# Patient Record
Sex: Male | Born: 1980 | Race: Black or African American | Hispanic: No | Marital: Single | Smoking: Former smoker
Health system: Southern US, Community
[De-identification: ages and names within clinical notes are randomized; demographics above are authoritative.]

## PROBLEM LIST (undated history)

## (undated) DIAGNOSIS — G35 Multiple sclerosis: Secondary | ICD-10-CM

## (undated) DIAGNOSIS — M1990 Unspecified osteoarthritis, unspecified site: Secondary | ICD-10-CM

## (undated) DIAGNOSIS — E669 Obesity, unspecified: Secondary | ICD-10-CM

## (undated) DIAGNOSIS — G709 Myoneural disorder, unspecified: Secondary | ICD-10-CM

## (undated) DIAGNOSIS — I10 Essential (primary) hypertension: Secondary | ICD-10-CM

## (undated) HISTORY — DX: Multiple sclerosis: G35

## (undated) HISTORY — DX: Essential (primary) hypertension: I10

## (undated) HISTORY — DX: Myoneural disorder, unspecified: G70.9

## (undated) HISTORY — DX: Unspecified osteoarthritis, unspecified site: M19.90

---
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
## 2001-05-18 ENCOUNTER — Emergency Department (HOSPITAL_COMMUNITY): Admission: EM | Admit: 2001-05-18 | Discharge: 2001-05-18 | Payer: Self-pay

## 2001-06-08 ENCOUNTER — Emergency Department (HOSPITAL_COMMUNITY): Admission: EM | Admit: 2001-06-08 | Discharge: 2001-06-09 | Payer: Self-pay | Admitting: Emergency Medicine

## 2001-06-08 ENCOUNTER — Encounter: Payer: Self-pay | Admitting: Emergency Medicine

## 2001-11-27 ENCOUNTER — Emergency Department (HOSPITAL_COMMUNITY): Admission: EM | Admit: 2001-11-27 | Discharge: 2001-11-28 | Payer: Self-pay | Admitting: Emergency Medicine

## 2002-05-01 ENCOUNTER — Emergency Department (HOSPITAL_COMMUNITY): Admission: EM | Admit: 2002-05-01 | Discharge: 2002-05-01 | Payer: Self-pay | Admitting: Emergency Medicine

## 2004-06-12 ENCOUNTER — Emergency Department (HOSPITAL_COMMUNITY): Admission: EM | Admit: 2004-06-12 | Discharge: 2004-06-12 | Payer: Self-pay | Admitting: Emergency Medicine

## 2005-06-17 ENCOUNTER — Emergency Department (HOSPITAL_COMMUNITY): Admission: EM | Admit: 2005-06-17 | Discharge: 2005-06-17 | Payer: Self-pay | Admitting: Emergency Medicine

## 2005-06-18 ENCOUNTER — Emergency Department (HOSPITAL_COMMUNITY): Admission: EM | Admit: 2005-06-18 | Discharge: 2005-06-18 | Payer: Self-pay | Admitting: Emergency Medicine

## 2006-04-08 ENCOUNTER — Emergency Department (HOSPITAL_COMMUNITY): Admission: EM | Admit: 2006-04-08 | Discharge: 2006-04-08 | Payer: Self-pay | Admitting: Emergency Medicine

## 2007-03-08 ENCOUNTER — Emergency Department (HOSPITAL_COMMUNITY): Admission: EM | Admit: 2007-03-08 | Discharge: 2007-03-08 | Payer: Self-pay | Admitting: Emergency Medicine

## 2008-06-09 ENCOUNTER — Emergency Department (HOSPITAL_COMMUNITY): Admission: EM | Admit: 2008-06-09 | Discharge: 2008-06-09 | Payer: Self-pay | Admitting: Family Medicine

## 2008-06-11 ENCOUNTER — Emergency Department (HOSPITAL_COMMUNITY): Admission: EM | Admit: 2008-06-11 | Discharge: 2008-06-11 | Payer: Self-pay | Admitting: Emergency Medicine

## 2008-06-17 ENCOUNTER — Encounter: Admission: RE | Admit: 2008-06-17 | Discharge: 2008-06-17 | Payer: Self-pay | Admitting: Family Medicine

## 2008-06-26 ENCOUNTER — Emergency Department (HOSPITAL_COMMUNITY): Admission: EM | Admit: 2008-06-26 | Discharge: 2008-06-26 | Payer: Self-pay | Admitting: Emergency Medicine

## 2008-07-27 ENCOUNTER — Ambulatory Visit: Payer: Self-pay | Admitting: Internal Medicine

## 2008-07-27 DIAGNOSIS — G373 Acute transverse myelitis in demyelinating disease of central nervous system: Secondary | ICD-10-CM | POA: Insufficient documentation

## 2008-07-27 DIAGNOSIS — I10 Essential (primary) hypertension: Secondary | ICD-10-CM | POA: Insufficient documentation

## 2008-08-09 ENCOUNTER — Encounter: Payer: Self-pay | Admitting: Internal Medicine

## 2009-02-17 DIAGNOSIS — G35 Multiple sclerosis: Secondary | ICD-10-CM

## 2009-02-17 HISTORY — DX: Multiple sclerosis: G35

## 2009-08-08 ENCOUNTER — Emergency Department (HOSPITAL_COMMUNITY): Admission: EM | Admit: 2009-08-08 | Discharge: 2009-08-08 | Payer: Self-pay | Admitting: Family Medicine

## 2009-08-11 ENCOUNTER — Encounter: Payer: Self-pay | Admitting: Internal Medicine

## 2010-03-21 NOTE — Letter (Signed)
Summary: [facility]   Imported By: Lester Bylas 08/17/2009 09:35:58  _____________________________________________________________________  External Attachment:    Type:   Image     Comment:   External Document

## 2010-03-21 NOTE — Letter (Signed)
Summary: [facility]   Imported By: Sherian Rein 09/10/2009 14:52:16  _____________________________________________________________________  External Attachment:    Type:   Image     Comment:   External Document

## 2010-05-05 LAB — POCT I-STAT, CHEM 8
BUN: 21 mg/dL (ref 6–23)
Calcium, Ion: 1.15 mmol/L (ref 1.12–1.32)
Chloride: 103 mEq/L (ref 96–112)
Creatinine, Ser: 1.2 mg/dL (ref 0.4–1.5)
Glucose, Bld: 90 mg/dL (ref 70–99)
HCT: 47 % (ref 39.0–52.0)
Hemoglobin: 16 g/dL (ref 13.0–17.0)
Potassium: 4 mEq/L (ref 3.5–5.1)
Sodium: 138 mEq/L (ref 135–145)
TCO2: 28 mmol/L (ref 0–100)

## 2010-05-28 LAB — POCT I-STAT, CHEM 8
BUN: 18 mg/dL (ref 6–23)
Calcium, Ion: 1.22 mmol/L (ref 1.12–1.32)
Chloride: 102 mEq/L (ref 96–112)
Creatinine, Ser: 1.1 mg/dL (ref 0.4–1.5)
Glucose, Bld: 100 mg/dL — ABNORMAL HIGH (ref 70–99)
HCT: 46 % (ref 39.0–52.0)
Hemoglobin: 15.6 g/dL (ref 13.0–17.0)
Potassium: 4.5 mEq/L (ref 3.5–5.1)
Sodium: 138 mEq/L (ref 135–145)
TCO2: 27 mmol/L (ref 0–100)

## 2010-05-29 LAB — COMPREHENSIVE METABOLIC PANEL
AST: 18 U/L (ref 0–37)
Albumin: 4.2 g/dL (ref 3.5–5.2)
Calcium: 9.6 mg/dL (ref 8.4–10.5)
Chloride: 104 mEq/L (ref 96–112)
Creatinine, Ser: 0.83 mg/dL (ref 0.4–1.5)
GFR calc Af Amer: >60 mL/min (ref >60)
Total Bilirubin: 1 mg/dL (ref 0.3–1.2)
Total Protein: 6.7 g/dL (ref 6.0–8.3)

## 2010-05-29 LAB — CBC
MCV: 91.6 fL (ref 78.0–100.0)
Platelets: 263 10*3/uL (ref 150–400)
RDW: 13.5 % (ref 11.5–15.5)
WBC: 4.4 10*3/uL (ref 4.0–10.5)

## 2010-05-29 LAB — RAPID URINE DRUG SCREEN, HOSP PERFORMED
Benzodiazepines: NOT DETECTED
Cocaine: NOT DETECTED
Opiates: NOT DETECTED

## 2010-05-29 LAB — URINALYSIS, ROUTINE W REFLEX MICROSCOPIC
Bilirubin Urine: NEGATIVE
Glucose, UA: NEGATIVE mg/dL
Ketones, ur: NEGATIVE mg/dL
pH: 7 (ref 5.0–8.0)

## 2010-05-29 LAB — DIFFERENTIAL
Eosinophils Relative: 3 % (ref 0–5)
Lymphocytes Relative: 40 % (ref 12–46)
Lymphs Abs: 1.8 10*3/uL (ref 0.7–4.0)
Monocytes Absolute: 0.3 10*3/uL (ref 0.1–1.0)
Monocytes Relative: 6 % (ref 3–12)
Neutro Abs: 2.2 10*3/uL (ref 1.7–7.7)

## 2012-07-07 ENCOUNTER — Ambulatory Visit (INDEPENDENT_AMBULATORY_CARE_PROVIDER_SITE_OTHER): Payer: BC Managed Care – PPO | Admitting: Family Medicine

## 2012-07-07 VITALS — BP 135/86 | HR 84 | Temp 98.1°F | Resp 18 | Ht 73.0 in | Wt 262.0 lb

## 2012-07-07 DIAGNOSIS — F39 Unspecified mood [affective] disorder: Secondary | ICD-10-CM

## 2012-07-07 DIAGNOSIS — I10 Essential (primary) hypertension: Secondary | ICD-10-CM

## 2012-07-07 MED ORDER — CITALOPRAM HYDROBROMIDE 20 MG PO TABS: 20.0000 mg | ORAL_TABLET | Freq: Every day | ORAL | Status: DC

## 2012-07-07 NOTE — Progress Notes (Signed)
x this is a 32 year old who comes in because of elevated blood pressures at work. He was started on Concerta 3 months ago and despite elevated blood pressures at his psychiatrist's office, he's been increasing the dose according to directions. He says it really hasn't come down too much although it does help him focus on at work. He's had headaches no chest pain or leg swelling.  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 He resolved the symptoms and has had no  Objective: Blood pressure 140/95 on recheck in the left arm.  Heart: Regular, chest: Clear, extremities: No edema  A long conversation with patient and his wife. Patient is in the habit of regular workouts until recently. He says that the exercises helped him more than anything. He talked about how exercise would be both ADHD and a blood pressure. He seems excited to try this is worried about mood changes.  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 Patient is to exercise.  Plan: I renewed prescription for Celexa and asked patient to discontinue the Concerta. I given him information about stimulant medication as well as ADHD. I suggested he get his blood pressure checked each week as it remains elevated after 3 weeks of exercise regularly and Celexa, return and we'll discuss blood pressure medication  Signed, Sheila Oats.D.

## 2012-07-07 NOTE — Patient Instructions (Signed)
Amphetamine; Dextroamphetamine extended-release capsules What is this medicine? AMPHETAMINE; DEXTROAMPHETAMINE (am FET a meen; dex troe am FET a meen) is used to treat attention-deficit hyperactivity disorder (ADHD). Federal law prohibits giving this medicine to any person other than the person for whom it was prescribed. Do not share this medicine with anyone else. This medicine may be used for other purposes; ask your health care provider or pharmacist if you have questions. What should I tell my health care provider before I take this medicine? They need to know if you have any of these conditions: -glaucoma -hardening or blockages of the arteries or heart blood vessels -heart disease or a heart defect -high blood pressure -history of alcohol or drug abuse -history of stroke -over-active thyroid gland -psychotic illness, depressed mood, or suicidal thoughts -recent weight loss -seizure disorder -Tourette's syndrome -an unusual or allergic reaction to dextroamphetamine, other amphetamines, other medicines, foods, dyes, or preservatives -pregnant or trying to get pregnant -breast-feeding How should I use this medicine? Take this medicine by mouth with a glass of water. Follow the directions on the prescription label. This medicine is taken just one time per day, usually in the morning after waking up. Take with or without food. Do not chew or crush this medicine. You may open the capsules and sprinkle the medicine onto a spoon of applesauce. If sprinkled on applesauce, take the dose immediately and do not crush or chew. Always drink a glass of water or other liquid after taking this medicine. Do not take your medicine more often than directed. A special MedGuide will be given to you by the pharmacist with each prescription and refill. Be sure to read this information carefully each time. Talk to your pediatrician regarding the use of this medicine in children. While this drug may be  prescribed for children as young as 6 years for selected conditions, precautions do apply. Overdosage: If you think you have taken too much of this medicine contact a poison control center or emergency room at once. NOTE: This medicine is only for you. Do not share this medicine with others. What if I miss a dose? If you miss a dose, take it as soon as you can. If it is almost time for your next dose, take only that dose. Do not take double or extra doses. What may interact with this medicine? Do not take this medicine with any of the following medications: -alcohol -certain migraine headache medicines like almotriptan, eletriptan, frovatriptan, naratriptan, rizatriptan, sumatriptan, zolmitriptan -lithium -medicines called MAO inhibitors like Nardil, Parnate, Marplan, Eldepryl -medicines to decrease appetite or cause weight loss -melatonin -meperidine -other stimulant medications like dexmethylphenidate, methylphenidate, modafinil -pimozide -procarbazine This medicine may also interact with the following medications: -acetazolamide -ammonium chloride -ascorbic acid -glutamic acid -medicines for colds, sinus, and breathing difficulties -medicines for depression, anxiety, or psychotic disturbances -medicines for high blood pressure and heart medicines -medicines for seizures -medicines for stomach acid -medicines that treat or prevent blood clots like warfarin -methenamine -narcotic pain relievers -norepinephrine -sodium acid phosphate -sodium bicarbonate This list may not describe all possible interactions. Give your health care provider a list of all the medicines, herbs, non-prescription drugs, or dietary supplements you use. Also tell them if you smoke, drink alcohol, or use illegal drugs. Some items may interact with your medicine. What should I watch for while using this medicine? Visit your doctor or health care professional for regular checks on your progress. This  prescription requires that you follow special procedures with your doctor  and pharmacy. You will need to have a new written prescription from your doctor every time you need a refill. This medicine may affect your concentration, or hide signs of tiredness. Until you know how this medicine affects you, do not drive, ride a bicycle, use machinery, or do anything that needs mental alertness. Tell your doctor or health care professional if this medicine loses its effects, or if you feel you need to take more than the prescribed amount. Do not change the dosage without talking to your doctor or health care professional. Decreased appetite is a common side effect when starting this medicine. Eating small, frequent meals or snacks can help. Talk to your doctor if you continue to have poor eating habits. Height and weight growth of a child taking this medicine will be monitored closely. If you are going to have surgery or will need an x-ray procedure that uses contrast agents, tell your doctor or health care professional that you are taking this medicine. What side effects may I notice from receiving this medicine? Side effects that you should report to your doctor or health care professional as soon as possible: -allergic reactions like skin rash, itching or hives, swelling of the face, lips, or tongue -anxiety, nervousness -changes in mood or behavior -chest pain -fast, irregular heartbeat -fever, or hot, dry skin -high blood pressure -muscle twitching -uncontrollable head, mouth, neck, arm, or leg movements Side effects that usually do not require medical attention (report to your doctor or health care professional if they continue or are bothersome): -difficulty sleeping -dizziness or light headedness -headache -nausea, vomiting -stomach cramps -weight loss This list may not describe all possible side effects. Call your doctor for medical advice about side effects. You may report side effects to  FDA at 1-800-FDA-1088. Where should I keep my medicine? Keep out of the reach of children. This medicine can be abused. Keep your medicine in a safe place to protect it from theft. Do not share this medicine with anyone. Selling or giving away this medicine is dangerous and against the law. Store at room temperature between 15 and 30 degrees C (59 and 86 degrees F). Keep container tightly closed. Protect from light. Throw away any unused medicine after the expiration date. NOTE: This sheet is a summary. It may not cover all possible information. If you have questions about this medicine, talk to your doctor, pharmacist, or health care provider.  2013, [employer] Standard. (05/01/2010 5:10:32 PM) Attention Deficit Hyperactivity Disorder Attention deficit hyperactivity disorder (ADHD) is a problem with behavior issues based on the way the brain functions (neurobehavioral disorder). It is a common reason for behavior and academic problems in school. CAUSES  The cause of ADHD is unknown in most cases. It may run in families. It sometimes can be associated with learning disabilities and other behavioral problems. SYMPTOMS  There are 3 types of ADHD. The 3 types and some of the symptoms include:  Inattentive  Gets bored or distracted easily.  Loses or forgets things. Forgets to hand in homework.  Has trouble organizing or completing tasks.  Difficulty staying on task.  An inability to organize daily tasks and school work.  Leaving projects, chores, or homework unfinished.  Trouble paying attention or responding to details. Careless mistakes.  Difficulty following directions. Often seems like is not listening.  Dislikes activities that require sustained attention (like chores or homework).  Hyperactive-impulsive  Feels like it is impossible to sit still or stay in a seat. Fidgeting with hands and  feet.  Trouble waiting turn.  Talking too much or out of turn. Interruptive.  Speaks  or acts impulsively.  Aggressive, disruptive behavior.  Constantly busy or on the go, noisy.  Combined  Has symptoms of both of the above. Often children with ADHD feel discouraged about themselves and with school. They often perform well below their abilities in school. These symptoms can cause problems in home, school, and in relationships with peers. As children get older, the excess motor activities can calm down, but the problems with paying attention and staying organized persist. Most children do not outgrow ADHD but with good treatment can learn to cope with the symptoms. DIAGNOSIS  When ADHD is suspected, the diagnosis should be made by professionals trained in ADHD.  Diagnosis will include:  Ruling out other reasons for the child's behavior.  The caregivers will check with the child's school and check their medical records.  They will talk to teachers and parents.  Behavior rating scales for the child will be filled out by those dealing with the child on a daily basis. A diagnosis is made only after all information has been considered. TREATMENT  Treatment usually includes behavioral treatment often along with medicines. It may include stimulant medicines. The stimulant medicines decrease impulsivity and hyperactivity and increase attention. Other medicines used include antidepressants and certain blood pressure medicines. Most experts agree that treatment for ADHD should address all aspects of the child's functioning. Treatment should not be limited to the use of medicines alone. Treatment should include structured classroom management. The parents must receive education to address rewarding good behavior, discipline, and limit-setting. Tutoring or behavioral therapy or both should be available for the child. If untreated, the disorder can have long-term serious effects into adolescence and adulthood. HOME CARE INSTRUCTIONS   Often with ADHD there is a lot of frustration among  the family in dealing with the illness. There is often blame and anger that is not warranted. This is a life long illness. There is no way to prevent ADHD. In many cases, because the problem affects the family as a whole, the entire family may need help. A therapist can help the family find better ways to handle the disruptive behaviors and promote change. If the child is young, most of the therapist's work is with the parents. Parents will learn techniques for coping with and improving their child's behavior. Sometimes only the child with the ADHD needs counseling. Your caregivers can help you make these decisions.  Children with ADHD may need help in organizing. Some helpful tips include:  Keep routines the same every day from wake-up time to bedtime. Schedule everything. This includes homework and playtime. This should include outdoor and indoor recreation. Keep the schedule on the refrigerator or a bulletin board where it is frequently seen. Mark schedule changes as far in advance as possible.  Have a place for everything and keep everything in its place. This includes clothing, backpacks, and school supplies.  Encourage writing down assignments and bringing home needed books.  Offer your child a well-balanced diet. Breakfast is especially important for school performance. Children should avoid drinks with caffeine including:  Soft drinks.  Coffee.  Tea.  However, some older children (adolescents) may find these drinks helpful in improving their attention.  Children with ADHD need consistent rules that they can understand and follow. If rules are followed, give small rewards. Children with ADHD often receive, and expect, criticism. Look for good behavior and praise it. Set realistic goals. Give  clear instructions. Look for activities that can foster success and self-esteem. Make time for pleasant activities with your child. Give lots of affection.  Parents are their children's greatest  advocates. Learn as much as possible about ADHD. This helps you become a stronger and better advocate for your child. It also helps you educate your child's teachers and instructors if they feel inadequate in these areas. Parent support groups are often helpful. A national group with local chapters is called CHADD (Children and Adults with Attention Deficit Hyperactivity Disorder). PROGNOSIS  There is no cure for ADHD. Children with the disorder seldom outgrow it. Many find adaptive ways to accommodate the ADHD as they mature. SEEK MEDICAL CARE IF:  Your child has repeated muscle twitches, cough or speech outbursts.  Your child has sleep problems.  Your child has a marked loss of appetite.  Your child develops depression.  Your child has new or worsening behavioral problems.  Your child develops dizziness.  Your child has a racing heart.  Your child has stomach pains.  Your child develops headaches. Document Released: 01/24/2002 Document Revised: 04/28/2011 Document Reviewed: 09/06/2007 [employer] Patient Information 2014 [place], [place].

## 2012-07-23 ENCOUNTER — Ambulatory Visit (INDEPENDENT_AMBULATORY_CARE_PROVIDER_SITE_OTHER): Payer: BC Managed Care – PPO | Admitting: Family Medicine

## 2012-07-23 ENCOUNTER — Encounter: Payer: Self-pay | Admitting: Family Medicine

## 2012-07-23 VITALS — BP 124/86 | HR 68 | Temp 98.4°F | Resp 20 | Ht 72.0 in | Wt 268.0 lb

## 2012-07-23 DIAGNOSIS — Z0000 Encounter for general adult medical examination without abnormal findings: Secondary | ICD-10-CM

## 2012-07-23 LAB — CBC WITH DIFFERENTIAL/PLATELET
Basophils Relative: 0.4 % (ref 0.0–3.0)
Eosinophils Relative: 2.2 % (ref 0.0–5.0)
HCT: 42 % (ref 39.0–52.0)
Lymphs Abs: 2.3 10*3/uL (ref 0.7–4.0)
MCV: 91.6 fl (ref 78.0–100.0)
Monocytes Absolute: 0.4 10*3/uL (ref 0.1–1.0)
Monocytes Relative: 7.7 % (ref 3.0–12.0)
Neutrophils Relative %: 46.2 % (ref 43.0–77.0)
Platelets: 288 10*3/uL (ref 150.0–400.0)
RBC: 4.59 Mil/uL (ref 4.22–5.81)
WBC: 5.4 10*3/uL (ref 4.5–10.5)

## 2012-07-23 LAB — HEPATIC FUNCTION PANEL
ALT: 38 U/L (ref 0–53)
AST: 21 U/L (ref 0–37)
Alkaline Phosphatase: 46 U/L (ref 39–117)
Total Bilirubin: 0.9 mg/dL (ref 0.3–1.2)

## 2012-07-23 LAB — BASIC METABOLIC PANEL
CO2: 26 mEq/L (ref 19–32)
Calcium: 9.8 mg/dL (ref 8.4–10.5)
Chloride: 103 mEq/L (ref 96–112)
Creatinine, Ser: 1 mg/dL (ref 0.4–1.5)
Glucose, Bld: 91 mg/dL (ref 70–99)

## 2012-07-23 LAB — POCT URINALYSIS DIPSTICK
Bilirubin, UA: NEGATIVE
Blood, UA: NEGATIVE
Ketones, UA: NEGATIVE
Protein, UA: NEGATIVE
pH, UA: 6.5

## 2012-07-23 LAB — TSH: TSH: 1.28 u[IU]/mL (ref 0.35–5.50)

## 2012-07-23 LAB — LIPID PANEL: Total CHOL/HDL Ratio: 5

## 2012-07-23 NOTE — Progress Notes (Signed)
  Subjective:    Patient ID: Darren Knapp, male    DOB: May 25, 1980, 32 y.o.   MRN: [id number]  HPI New patient to establish care He has not had her regular physician in several years. Here for complete physical. Past medical history reviewed. Borderline elevated blood pressure in the past. He has question of multiple sclerosis. [facility]. Currently not on any medications. Recently started on citalopram presumably for anxiety symptoms but he discontinued. He is not aware of any specific anxiety disorder diagnosis. Denies any depression symptoms  He's had some weight gain over the past few years. Currently not exercising but plans to start back soon. No prior surgeries. Last tetanus unknown.  Patient is single. Works with [employer]. Quit smoking about 5 years ago. Drinks about 40 ounces of beer per day  Family history significant for parents with hypertension  Past Medical History  Diagnosis Date  . Arthritis   . Hypertension   . Neuromuscular disorder   . Multiple sclerosis 2011   No past surgical history on file.  reports that he has quit smoking. He has never used smokeless tobacco. He reports that he drinks about 4.2 ounces of alcohol per week. He reports that he does not use illicit drugs. family history includes Heart disease in his maternal grandfather and Hypertension in his father and mother. No Known Allergies    Review of Systems  Constitutional: Negative for fever, chills, activity change, appetite change, fatigue and unexpected weight change.  HENT: Negative for ear pain, congestion and trouble swallowing.   Eyes: Negative for pain and visual disturbance.  Respiratory: Negative for cough, shortness of breath and wheezing.   Cardiovascular: Negative for chest pain and palpitations.  Gastrointestinal: Negative for nausea, vomiting, abdominal pain, diarrhea, constipation, blood in stool, abdominal distention and rectal pain.   Endocrine: Negative for polydipsia and polyuria.  Genitourinary: Negative for dysuria, hematuria and testicular pain.  Musculoskeletal: Negative for joint swelling and arthralgias.  Skin: Negative for rash.  Neurological: Negative for dizziness, syncope and headaches.  Hematological: Negative for adenopathy.  Psychiatric/Behavioral: Negative for confusion and dysphoric mood.       Objective:   Physical Exam  Constitutional: He is oriented to person, place, and time. He appears well-developed and well-nourished. No distress.  HENT:  Head: Normocephalic and atraumatic.  Right Ear: External ear normal.  Left Ear: External ear normal.  Mouth/Throat: Oropharynx is clear and moist.  Eyes: Conjunctivae and EOM are normal. Pupils are equal, round, and reactive to light.  Neck: Normal range of motion. Neck supple. No thyromegaly present.  Cardiovascular: Normal rate, regular rhythm and normal heart sounds.   No murmur heard. Pulmonary/Chest: No respiratory distress. He has no wheezes. He has no rales.  Abdominal: Soft. Bowel sounds are normal. He exhibits no distension and no mass. There is no tenderness. There is no rebound and no guarding.  Musculoskeletal: He exhibits no edema.  Lymphadenopathy:    He has no cervical adenopathy.  Neurological: He is alert and oriented to person, place, and time. He displays normal reflexes. No cranial nerve deficit.  Skin: No rash noted.  Psychiatric: He has a normal mood and affect.          Assessment & Plan:  Complete physical. Obtain screening labs. Confirm date of last tetanus. Establish more regular exercise. Monitor blood pressures closely. We strongly advise you to lose some weight and reduce sodium intake to manage borderline elevated blood pressure

## 2012-07-23 NOTE — Patient Instructions (Addendum)
Try to establish regular exercise and lose some weight. Limit beer to 24 ounces per day.

## 2012-07-26 NOTE — Progress Notes (Signed)
Quick Note:  Pt informed onb VM ______

## 2015-02-16 ENCOUNTER — Ambulatory Visit (INDEPENDENT_AMBULATORY_CARE_PROVIDER_SITE_OTHER): Payer: BLUE CROSS/BLUE SHIELD | Admitting: Family Medicine

## 2015-02-16 VITALS — BP 134/88 | HR 75 | Temp 98.5°F

## 2015-02-16 DIAGNOSIS — Z23 Encounter for immunization: Secondary | ICD-10-CM

## 2015-02-16 DIAGNOSIS — R0600 Dyspnea, unspecified: Secondary | ICD-10-CM

## 2015-02-16 NOTE — Progress Notes (Signed)
   Subjective:    Patient ID: Darren Knapp, male    DOB: 1980-06-27, 34 y.o.   MRN: [id number]  HPI Patient seen with episodic shortness of breath  First episode occurred about 3 months ago when he was driving to get breakfast. He has sudden symptoms of shortness of breath which occured in different settings including at home and sometimes driving. No clear provoking factors. Symptoms only last about 15-20 seconds. No associated symptoms. Denies any chest pains, dizziness, syncope, or any focal neurologic symptoms. Frequency is about one episode every 2 weeks. Symptoms never last more than 15-20 seconds Exercises about 4-5 days per week without difficulty. Symptoms are never exertional.  He has no chronic medical problems. History of borderline elevated blood pressure the past. Nonsmoker.  Past Medical History  Diagnosis Date  . Arthritis   . Hypertension   . Neuromuscular disorder   . Multiple sclerosis 2011   No past surgical history on file.  reports that he has quit smoking. He has never used smokeless tobacco. He reports that he drinks about 4.2 oz of alcohol per week. He reports that he does not use illicit drugs. family history includes Heart disease in his maternal grandfather; Hypertension in his father and mother. No Known Allergies    Review of Systems  Constitutional: Negative for fatigue.  Eyes: Negative for visual disturbance.  Respiratory: Positive for shortness of breath. Negative for apnea, cough, chest tightness and wheezing.   Cardiovascular: Negative for chest pain, palpitations and leg swelling.  Gastrointestinal: Negative for nausea, vomiting and abdominal pain.  Genitourinary: Negative for dysuria.  Neurological: Negative for dizziness, syncope, weakness, light-headedness and headaches.  Psychiatric/Behavioral: Negative for dysphoric mood.       Objective:   Physical Exam  Constitutional: He is oriented to person, place, and time. He appears  well-developed and well-nourished.  Neck: Neck supple. No thyromegaly present.  Cardiovascular: Normal rate and regular rhythm.   Pulmonary/Chest: Effort normal and breath sounds normal. No respiratory distress. He has no wheezes. He has no rales.  Musculoskeletal: He exhibits no edema.  Neurological: He is alert and oriented to person, place, and time. No cranial nerve deficit.  Psychiatric: He has a normal mood and affect. His behavior is normal.          Assessment & Plan:  Episodic dyspnea. Symptoms are very transient using lasting 15-20 seconds. He's never had any exertional symptoms. Suspicion for pulmonary or cardiac etiology is very low. Question anxiety related. Would not be typical for panic disorder with brief duration. Check EKG.   EKG normal sinus rhythm with no acute changes.  We've recommended observation for now.  Follow up for any exertional symptoms or new symptoms.

## 2015-02-16 NOTE — Progress Notes (Signed)
Pre visit review using our clinic review tool, if applicable. No additional management support is needed unless otherwise documented below in the visit note. 

## 2015-02-16 NOTE — Patient Instructions (Signed)
Follow up for any exertional chest pain, increased shortness of breath, extreme dizziness or syncope.

## 2015-03-02 ENCOUNTER — Other Ambulatory Visit (INDEPENDENT_AMBULATORY_CARE_PROVIDER_SITE_OTHER): Payer: BLUE CROSS/BLUE SHIELD

## 2015-03-02 DIAGNOSIS — R7989 Other specified abnormal findings of blood chemistry: Secondary | ICD-10-CM | POA: Diagnosis not present

## 2015-03-02 DIAGNOSIS — Z0000 Encounter for general adult medical examination without abnormal findings: Secondary | ICD-10-CM

## 2015-03-02 LAB — CBC WITH DIFFERENTIAL/PLATELET
Basophils Absolute: 0 10*3/uL (ref 0.0–0.1)
Basophils Relative: 0.7 % (ref 0.0–3.0)
EOS ABS: 0.2 10*3/uL (ref 0.0–0.7)
EOS PCT: 2.9 % (ref 0.0–5.0)
HCT: 40.4 % (ref 39.0–52.0)
Hemoglobin: 13.7 g/dL (ref 13.0–17.0)
LYMPHS ABS: 2.1 10*3/uL (ref 0.7–4.0)
Lymphocytes Relative: 38.2 % (ref 12.0–46.0)
MCHC: 33.8 g/dL (ref 30.0–36.0)
MCV: 92.1 fl (ref 78.0–100.0)
MONO ABS: 0.4 10*3/uL (ref 0.1–1.0)
Monocytes Relative: 7.5 % (ref 3.0–12.0)
NEUTROS PCT: 50.7 % (ref 43.0–77.0)
Neutro Abs: 2.8 10*3/uL (ref 1.4–7.7)
Platelets: 292 10*3/uL (ref 150.0–400.0)
RBC: 4.39 Mil/uL (ref 4.22–5.81)
RDW: 13.8 % (ref 11.5–15.5)
WBC: 5.4 10*3/uL (ref 4.0–10.5)

## 2015-03-02 LAB — LIPID PANEL
CHOL/HDL RATIO: 5
CHOLESTEROL: 229 mg/dL — AB (ref 0–200)
HDL: 47.7 mg/dL (ref >39.00)
NonHDL: 181.07
TRIGLYCERIDES: 256 mg/dL — AB (ref 0.0–149.0)
VLDL: 51.2 mg/dL — AB (ref 0.0–40.0)

## 2015-03-02 LAB — BASIC METABOLIC PANEL
BUN: 22 mg/dL (ref 6–23)
CALCIUM: 9.6 mg/dL (ref 8.4–10.5)
CO2: 28 mEq/L (ref 19–32)
CREATININE: 0.97 mg/dL (ref 0.40–1.50)
Chloride: 100 mEq/L (ref 96–112)
GFR: 113.69 mL/min (ref >60.00)
GLUCOSE: 89 mg/dL (ref 70–99)
POTASSIUM: 4.1 meq/L (ref 3.5–5.1)
Sodium: 137 mEq/L (ref 135–145)

## 2015-03-02 LAB — HEPATIC FUNCTION PANEL
ALT: 29 U/L (ref 0–53)
AST: 18 U/L (ref 0–37)
Albumin: 4.4 g/dL (ref 3.5–5.2)
Alkaline Phosphatase: 58 U/L (ref 39–117)
BILIRUBIN TOTAL: 0.5 mg/dL (ref 0.2–1.2)
Bilirubin, Direct: 0.1 mg/dL (ref 0.0–0.3)
Total Protein: 7.5 g/dL (ref 6.0–8.3)

## 2015-03-02 LAB — LDL CHOLESTEROL, DIRECT: LDL DIRECT: 150 mg/dL

## 2015-03-02 LAB — TSH: TSH: 1.03 u[IU]/mL (ref 0.35–4.50)

## 2015-03-09 ENCOUNTER — Ambulatory Visit (INDEPENDENT_AMBULATORY_CARE_PROVIDER_SITE_OTHER): Payer: BLUE CROSS/BLUE SHIELD | Admitting: Family Medicine

## 2015-03-09 VITALS — BP 140/96 | HR 88 | Temp 98.2°F | Ht 72.25 in | Wt 289.8 lb

## 2015-03-09 DIAGNOSIS — F1010 Alcohol abuse, uncomplicated: Secondary | ICD-10-CM | POA: Diagnosis not present

## 2015-03-09 DIAGNOSIS — Z0000 Encounter for general adult medical examination without abnormal findings: Secondary | ICD-10-CM

## 2015-03-09 DIAGNOSIS — G35 Multiple sclerosis: Secondary | ICD-10-CM | POA: Diagnosis not present

## 2015-03-09 NOTE — Progress Notes (Signed)
   Subjective:    Patient ID: Darren Knapp, male    DOB: Nov 06, 1980, 35 y.o.   MRN: [id number]  HPI Patient here for complete physical. He has history of multiple sclerosis followed at [facility]. He is on a drug for that and takes no other medications. History of borderline hypertension in the past. Patient states that he has had some alcohol issues and currently drinks about a "pint "of whiskey per day. This is almost daily. He very much wishes to quit. He is interested in possible outpatient programs. Nonsmoker. No consistent exercise.  Past Medical History  Diagnosis Date  . Arthritis   . Hypertension   . Neuromuscular disorder   . Multiple sclerosis 2011   No past surgical history on file.  reports that he has quit smoking. He has never used smokeless tobacco. He reports that he drinks about 4.2 oz of alcohol per week. He reports that he does not use illicit drugs. family history includes Heart disease in his maternal grandfather; Hypertension in his father and mother. No Known Allergies    Review of Systems  Constitutional: Negative for fever, activity change, appetite change and fatigue.  HENT: Negative for congestion, ear pain and trouble swallowing.   Eyes: Negative for pain and visual disturbance.  Respiratory: Negative for cough, shortness of breath and wheezing.   Cardiovascular: Negative for chest pain and palpitations.  Gastrointestinal: Negative for nausea, vomiting, abdominal pain, diarrhea, constipation, blood in stool, abdominal distention and rectal pain.  Genitourinary: Negative for dysuria, hematuria and testicular pain.  Musculoskeletal: Negative for joint swelling and arthralgias.  Skin: Negative for rash.  Neurological: Negative for dizziness, syncope and headaches.  Hematological: Negative for adenopathy.  Psychiatric/Behavioral: Negative for confusion and dysphoric mood.       Objective:   Physical Exam  Constitutional: He is oriented to person, place,  and time. He appears well-developed and well-nourished. No distress.  HENT:  Head: Normocephalic and atraumatic.  Right Ear: External ear normal.  Left Ear: External ear normal.  Mouth/Throat: Oropharynx is clear and moist.  Eyes: Conjunctivae and EOM are normal. Pupils are equal, round, and reactive to light.  Neck: Normal range of motion. Neck supple. No thyromegaly present.  Cardiovascular: Normal rate, regular rhythm and normal heart sounds.   No murmur heard. Pulmonary/Chest: No respiratory distress. He has no wheezes. He has no rales.  Abdominal: Soft. Bowel sounds are normal. He exhibits no distension and no mass. There is no tenderness. There is no rebound and no guarding.  Musculoskeletal: He exhibits no edema.  Lymphadenopathy:    He has no cervical adenopathy.  Neurological: He is alert and oriented to person, place, and time. He displays normal reflexes. No cranial nerve deficit.  Skin: No rash noted.  Psychiatric: He has a normal mood and affect.          Assessment & Plan:  Complete physical. Labs reviewed. He has high triglycerides and slightly elevated lipids. He also has borderline elevated blood pressure which we explained is very likely related to his alcohol. We have strongly advocated abstinence and have given him name of a couple of local places including [facility]. Monitor blood pressure and be in touch if consistently greater than 140/90 over the next few weeks

## 2015-03-09 NOTE — Patient Instructions (Addendum)
Alcohol Use Disorder Alcohol use disorder is a mental disorder. It is not a one-time incident of heavy drinking. Alcohol use disorder is the excessive and uncontrollable use of alcohol over time that leads to problems with functioning in one or more areas of daily living. People with this disorder risk harming themselves and others when they drink to excess. Alcohol use disorder also can cause other mental disorders, such as mood and anxiety disorders, and serious physical problems. People with alcohol use disorder often misuse other drugs.  Alcohol use disorder is common and widespread. Some people with this disorder drink alcohol to cope with or escape from negative life events. Others drink to relieve chronic pain or symptoms of mental illness. People with a family history of alcohol use disorder are at higher risk of losing control and using alcohol to excess.  Drinking too much alcohol can cause injury, accidents, and health problems. One drink can be too much when you are:  Working.  Pregnant or breastfeeding.  Taking medicines. Ask your doctor.  Driving or planning to drive. SYMPTOMS  Signs and symptoms of alcohol use disorder may include the following:   Consumption ofalcohol inlarger amounts or over a longer period of time than intended.  Multiple unsuccessful attempts to cutdown or control alcohol use.   A great deal of time spent obtaining alcohol, using alcohol, or recovering from the effects of alcohol (hangover).  A strong desire or urge to use alcohol (cravings).   Continued use of alcohol despite problems at work, school, or home because of alcohol use.   Continued use of alcohol despite problems in relationships because of alcohol use.  Continued use of alcohol in situations when it is physically hazardous, such as driving a car.  Continued use of alcohol despite awareness of a physical or psychological problem that is likely related to alcohol use. Physical  problems related to alcohol use can involve the brain, heart, liver, stomach, and intestines. Psychological problems related to alcohol use include intoxication, depression, anxiety, psychosis, delirium, and dementia.   The need for increased amounts of alcohol to achieve the same desired effect, or a decreased effect from the consumption of the same amount of alcohol (tolerance).  Withdrawal symptoms upon reducing or stopping alcohol use, or alcohol use to reduce or avoid withdrawal symptoms. Withdrawal symptoms include:  Racing heart.  Hand tremor.  Difficulty sleeping.  Nausea.  Vomiting.  Hallucinations.  Restlessness.  Seizures. DIAGNOSIS Alcohol use disorder is diagnosed through an assessment by your health care provider. Your health care provider may start by asking three or four questions to screen for excessive or problematic alcohol use. To confirm a diagnosis of alcohol use disorder, at least two symptoms must be present within a 47-monthperiod. The severity of alcohol use disorder depends on the number of symptoms:  Mild--two or three.  Moderate--four or five.  Severe--six or more. Your health care provider may perform a physical exam or use results from lab tests to see if you have physical problems resulting from alcohol use. Your health care provider may refer you to a mental health professional for evaluation. TREATMENT  Some people with alcohol use disorder are able to reduce their alcohol use to low-risk levels. Some people with alcohol use disorder need to quit drinking alcohol. When necessary, mental health professionals with specialized training in substance use treatment can help. Your health care provider can help you decide how severe your alcohol use disorder is and what type of treatment you need.  The following forms of treatment are available:   Detoxification. Detoxification involves the use of prescription medicines to prevent alcohol withdrawal  symptoms in the first week after quitting. This is important for people with a history of symptoms of withdrawal and for heavy drinkers who are likely to have withdrawal symptoms. Alcohol withdrawal can be dangerous and, in severe cases, cause death. Detoxification is usually provided in a hospital or in-patient substance use treatment facility.  Counseling or talk therapy. Talk therapy is provided by substance use treatment counselors. It addresses the reasons people use alcohol and ways to keep them from drinking again. The goals of talk therapy are to help people with alcohol use disorder find healthy activities and ways to cope with life stress, to identify and avoid triggers for alcohol use, and to handle cravings, which can cause relapse.  Medicines.Different medicines can help treat alcohol use disorder through the following actions:  Decrease alcohol cravings.  Decrease the positive reward response felt from alcohol use.  Produce an uncomfortable physical reaction when alcohol is used (aversion therapy).  Support groups. Support groups are run by people who have quit drinking. They provide emotional support, advice, and guidance. These forms of treatment are often combined. Some people with alcohol use disorder benefit from intensive combination treatment provided by specialized substance use treatment centers. Both inpatient and outpatient treatment programs are available.   This information is not intended to replace advice given to you by your health care provider. Make sure you discuss any questions you have with your health care provider.   Document Released: 03/13/2004 Document Revised: 02/24/2014 Document Reviewed: 05/13/2012 Elsevier Interactive Patient Education 2016 [employer]. Food Choices to Lower Your Triglycerides Triglycerides are a type of fat in your blood. High levels of triglycerides can increase the risk of heart disease and stroke. If your triglyceride levels are  high, the foods you eat and your eating habits are very important. Choosing the right foods can help lower your triglycerides.  WHAT GENERAL GUIDELINES DO I NEED TO FOLLOW?  Lose weight if you are overweight.   Limit or avoid alcohol.   Fill one half of your plate with vegetables and green salads.   Limit fruit to two servings a day. Choose fruit instead of juice.   Make one fourth of your plate whole grains. Look for the word "whole" as the first word in the ingredient list.  Fill one fourth of your plate with lean protein foods.  Enjoy fatty fish (such as salmon, mackerel, sardines, and tuna) three times a week.   Choose healthy fats.   Limit foods high in starch and sugar.  Eat more home-cooked food and less restaurant, buffet, and fast food.  Limit fried foods.  Cook foods using methods other than frying.  Limit saturated fats.  Check ingredient lists to avoid foods with partially hydrogenated oils (trans fats) in them. WHAT FOODS CAN I EAT?  Grains Whole grains, such as whole wheat or whole grain breads, crackers, cereals, and pasta. Unsweetened oatmeal, bulgur, barley, quinoa, or brown rice. Corn or whole wheat flour tortillas.  Vegetables Fresh or frozen vegetables (raw, steamed, roasted, or grilled). Green salads. Fruits All fresh, canned (in natural juice), or frozen fruits. Meat and Other Protein Products Ground beef (85% or leaner), grass-fed beef, or beef trimmed of fat. Skinless chicken or [place]. Ground chicken or [place]. Pork trimmed of fat. All fish and seafood. Eggs. Dried beans, peas, or lentils. Unsalted nuts or seeds. Unsalted canned or dry  beans. Dairy Low-fat dairy products, such as skim or 1% milk, 2% or reduced-fat cheeses, low-fat ricotta or cottage cheese, or plain low-fat yogurt. Fats and Oils Tub margarines without trans fats. Light or reduced-fat mayonnaise and salad dressings. Avocado. Safflower, olive, or canola oils. Natural peanut or  almond butter. The items listed above may not be a complete list of recommended foods or beverages. Contact your dietitian for more options. WHAT FOODS ARE NOT RECOMMENDED?  Grains White bread. White pasta. White rice. Cornbread. Bagels, pastries, and croissants. Crackers that contain trans fat. Vegetables White potatoes. Corn. Creamed or fried vegetables. Vegetables in a cheese sauce. Fruits Dried fruits. Canned fruit in light or heavy syrup. Fruit juice. Meat and Other Protein Products Fatty cuts of meat. Ribs, chicken wings, bacon, sausage, bologna, salami, chitterlings, fatback, hot dogs, bratwurst, and packaged luncheon meats. Dairy Whole or 2% milk, cream, half-and-half, and cream cheese. Whole-fat or sweetened yogurt. Full-fat cheeses. Nondairy creamers and whipped toppings. Processed cheese, cheese spreads, or cheese curds. Sweets and Desserts Corn syrup, sugars, honey, and molasses. Candy. Jam and jelly. Syrup. Sweetened cereals. Cookies, pies, cakes, donuts, muffins, and ice cream. Fats and Oils Butter, stick margarine, lard, shortening, ghee, or bacon fat. Coconut, palm kernel, or palm oils. Beverages Alcohol. Sweetened drinks (such as sodas, lemonade, and fruit drinks or punches). The items listed above may not be a complete list of foods and beverages to avoid. Contact your dietitian for more information.   This information is not intended to replace advice given to you by your health care provider. Make sure you discuss any questions you have with your health care provider.   Document Released: 11/22/2003 Document Revised: 02/24/2014 Document Reviewed: 12/08/2012 Elsevier Interactive Patient Education 2016 Elsevier Inc[employer]  Monitor blood pressure and be in touch if consistently greater than 140/90

## 2015-03-09 NOTE — Progress Notes (Signed)
Pre visit review using our clinic review tool, if applicable. No additional management support is needed unless otherwise documented below in the visit note. 

## 2015-04-12 DIAGNOSIS — H903 Sensorineural hearing loss, bilateral: Secondary | ICD-10-CM | POA: Insufficient documentation

## 2015-04-12 DIAGNOSIS — H6122 Impacted cerumen, left ear: Secondary | ICD-10-CM | POA: Insufficient documentation

## 2015-09-14 ENCOUNTER — Ambulatory Visit (INDEPENDENT_AMBULATORY_CARE_PROVIDER_SITE_OTHER): Payer: BLUE CROSS/BLUE SHIELD | Admitting: Family Medicine

## 2015-09-14 ENCOUNTER — Encounter: Payer: Self-pay | Admitting: Family Medicine

## 2015-09-14 VITALS — BP 120/90 | HR 62 | Temp 98.2°F | Ht 72.25 in | Wt 283.0 lb

## 2015-09-14 DIAGNOSIS — R030 Elevated blood-pressure reading, without diagnosis of hypertension: Secondary | ICD-10-CM | POA: Diagnosis not present

## 2015-09-14 DIAGNOSIS — IMO0001 Reserved for inherently not codable concepts without codable children: Secondary | ICD-10-CM

## 2015-09-14 DIAGNOSIS — Z113 Encounter for screening for infections with a predominantly sexual mode of transmission: Secondary | ICD-10-CM | POA: Diagnosis not present

## 2015-09-14 NOTE — Progress Notes (Signed)
Pre visit review using our clinic review tool, if applicable. No additional management support is needed unless otherwise documented below in the visit note. 

## 2015-09-14 NOTE — Addendum Note (Signed)
Addended by: Baldwin Crown D on: 09/14/2015 10:26 AM   Modules accepted: Orders

## 2015-09-14 NOTE — Progress Notes (Signed)
Subjective:     Patient ID: Darren Knapp, male   DOB: [birth year], 35 y.o.   MRN: [id number]  HPI Patient here for follow-up regarding elevated blood pressure from physical back in January He was drinking alcohol fairly heavily and has been totally abstinent since then. He started Alcoholics Anonymous and has done extremely well. He is exercising several days per week. He is watching his salt intake. No headaches. No dizziness.  Requesting STD screening. He is currently monogamous but several months ago increased risk from exposure. He has not had any symptoms such as dysuria or any skin rashes or adenopathy. Denies prior history of STD.  Past Medical History:  Diagnosis Date  . Arthritis   . Hypertension   . Multiple sclerosis (HCC) 2011  . Neuromuscular disorder (HCC)    No past surgical history on file.  reports that he has quit smoking. He has never used smokeless tobacco. He reports that he drinks about 4.2 oz of alcohol per week . He reports that he does not use drugs. family history includes Heart disease in his maternal grandfather; Hypertension in his father and mother. No Known Allergies   Review of Systems  Constitutional: Negative for fatigue.  Eyes: Negative for visual disturbance.  Respiratory: Negative for cough, chest tightness and shortness of breath.   Cardiovascular: Negative for chest pain, palpitations and leg swelling.  Neurological: Negative for dizziness, syncope, weakness, light-headedness and headaches.       Objective:   Physical Exam  Constitutional: He is oriented to person, place, and time. He appears well-developed and well-nourished.  HENT:  Right Ear: External ear normal.  Left Ear: External ear normal.  Mouth/Throat: Oropharynx is clear and moist.  Eyes: Pupils are equal, round, and reactive to light.  Neck: Neck supple. No thyromegaly present.  Cardiovascular: Normal rate and regular rhythm.   Pulmonary/Chest: Effort normal and breath sounds  normal. No respiratory distress. He has no wheezes. He has no rales.  Musculoskeletal: He exhibits no edema.  Neurological: He is alert and oriented to person, place, and time.       Assessment:     #1 history of elevated blood pressure. Improved. Repeat left arm seated after rest 120/86  #2 patient requesting STD screening. He is asymptomatic.    Plan:     -Continue regular exercise habits -Continue alcohol abstinence -Check labs with HIV, RPR, urine for GC and Chlamydia  Kristian Covey MD [facility] at [facility]

## 2015-09-15 LAB — GC/CHLAMYDIA PROBE AMP
CT Probe RNA: NOT DETECTED
GC PROBE AMP APTIMA: NOT DETECTED

## 2015-09-15 LAB — HIV ANTIBODY (ROUTINE TESTING W REFLEX): HIV: NONREACTIVE

## 2015-09-15 LAB — RPR

## 2016-01-22 ENCOUNTER — Telehealth: Payer: Self-pay | Admitting: Family Medicine

## 2016-01-22 NOTE — Telephone Encounter (Signed)
Pt was last seen on 09-14-2015.  Please advise on referral.

## 2016-01-22 NOTE — Telephone Encounter (Signed)
° ° °  Pt call to ask for a referral to see a neurologist  For his MS .

## 2016-01-22 NOTE — Telephone Encounter (Signed)
OK to refer.  I believe he is already [facility].  Is he just needing referral to them?  OK

## 2016-01-24 NOTE — Telephone Encounter (Signed)
Left message for patient to call back. I need to know which Neurology office he wants to be referred too.

## 2016-01-29 ENCOUNTER — Other Ambulatory Visit: Payer: Self-pay | Admitting: Emergency Medicine

## 2016-01-29 DIAGNOSIS — G35 Multiple sclerosis: Secondary | ICD-10-CM

## 2016-01-29 NOTE — Telephone Encounter (Signed)
Noted  

## 2016-01-29 NOTE — Telephone Encounter (Signed)
Called and spoke with pt informing pt that referral has been placed. Pt states that he does have a neurologist but wanted Dr. Caryl NeverBurchette to place an order because he would like a second opinion. Informed pt that order were placed and he should receive a call in a few days to schedule and appointment. Pt verbalized understanding, nothing further needed at this time.

## 2016-03-14 ENCOUNTER — Encounter: Payer: Self-pay | Admitting: Neurology

## 2016-03-14 ENCOUNTER — Ambulatory Visit (INDEPENDENT_AMBULATORY_CARE_PROVIDER_SITE_OTHER): Payer: BLUE CROSS/BLUE SHIELD | Admitting: Neurology

## 2016-03-14 VITALS — BP 122/74 | HR 98 | Ht 72.25 in | Wt 288.2 lb

## 2016-03-14 DIAGNOSIS — G35 Multiple sclerosis: Secondary | ICD-10-CM | POA: Diagnosis not present

## 2016-03-14 NOTE — Patient Instructions (Signed)
I agree that you have multiple sclerosis.  I agree that you should be on a medication for MS.  You are doing well and we would want to prevent further flare-ups and progression of the disease.

## 2016-03-14 NOTE — Progress Notes (Signed)
NEUROLOGY CONSULTATION NOTE  Kani Chauvin MRN: [id number] DOB: 1980-09-17  Referring provider: Dr. Caryl Never Primary care provider: Dr. Caryl Never  Reason for consult:  Second opinion regarding diagnosis of multiple sclerosis  HISTORY OF PRESENT ILLNESS: Darren Knapp is a 36 year old right-handed male with hypertension and past history of alcohol abuse who presents to for a second opinion regarding multiple sclerosis.  History supplemented by prior neurologists' notes from [facility].  He is a patient at the Multiple Sclerosis clinic at [facility], but presents today regarding diagnosis and management.  In April 2010, he had an episode of cervical transverse myelitis.  NMO IgG at that time was negative.  He reportedly had a lumbar puncture performed, but I do not have results of the CSF. In June 2011, he developed dizziness and unsteady gait.  MRI of brain reportedly demonstrated new lesions in the brain, including a small lesion in the cerebellum and a lesion in the genu of the corpus callosum.  Evolution of the prior cervical lesion on new MRI demonstrated that he had two lesions, both less than 2 segments long.    Medications: In 2011, he was initially started on Tysabri.  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  He decided to start Gilenya but was found to have an asymptomatic first degree heart block.  The heart block resolved but he decided not to continue Gilenya.    He then started Copaxone, but stopped due to experiencing dizziness after each injection.  He was then started on Tecfidera, but stopped about 3 months ago because he felt lightheaded.  It was then recommended to start what I assume is Ocrevus (he said it's an injection you get once and again in 2 weeks, followed by every 6 months).  However, he wanted to speak about it with me first.  03/09/14 MRI brain with and  without contrast:  "No acute ischemia. 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 No hemorrhage, hydrocephalus, or abnormal mass effect. White matter: Similar T2/FLAIR signal hyperintensity in the right genu of the corpus callosum. No new white matter lesion. Contrast: No contrast-enhancing lesions." 03/09/14 MRI cervical spine with and without contrast: "Intramedullary STIR hyperintense signal involving the spinal cord spanning C1 to mid C4. Slightly increased craniocaudal extent. Mild interval myelomalacia at the C2-C3 disc level. No new lesions or abnormal spinal cord enhancement. Contrast: No abnormal enhancement within the spinal canal to suggest neoplasm or infection."  Labs: 09/14/15: HIV and RPR nonreactive. 03/02/15:  CBC with WBC 5.4, HGB 13.7, HCT 40.4, PLT 292, ALC 2.1; hepatic panel with total bili 0.5, ALP 58, AST 18 and ALT 29.   PAST MEDICAL HISTORY: Past Medical History:  Diagnosis Date  . Arthritis   . Hypertension   . Multiple sclerosis (HCC) 2011  . Neuromuscular disorder (HCC)     PAST SURGICAL HISTORY: No past surgical history on file.  MEDICATIONS: Current Outpatient Prescriptions on File Prior to Visit  Medication Sig Dispense Refill  . COPAXONE 40 MG/ML SOSY     . glucosamine-chondroitin 500-400 MG tablet Take 1 tablet by mouth 3 (three) times daily.    . vitamin C (ASCORBIC ACID) 500 MG tablet Take 500 mg by mouth daily.     No current facility-administered medications on file prior to visit.     ALLERGIES: No Known Allergies  FAMILY HISTORY: Family History  Problem  Relation Age of Onset  . Hypertension Mother   . Hypertension Father   . Heart disease Maternal Grandfather     SOCIAL HISTORY: Social History   Social History  . Marital status: Single    Spouse name: N/A  . Number of children: N/A  . Years of education: N/A   Occupational History  . Not on file.   Social History Main Topics  . Smoking  status: Former Games developer  . Smokeless tobacco: Never Used  . Alcohol use 4.2 oz/week    7 Cans of beer per week  . Drug use: No  . Sexual activity: Not on file   Other Topics Concern  . Not on file   Social History Narrative  . No narrative on file    REVIEW OF SYSTEMS: Constitutional: No fevers, chills, or sweats, no generalized fatigue, change in appetite Eyes: No visual changes, double vision, eye pain Ear, nose and throat: No hearing loss, ear pain, nasal congestion, sore throat Cardiovascular: No chest pain, palpitations Respiratory:  No shortness of breath at rest or with exertion, wheezes GastrointestinaI: No nausea, vomiting, diarrhea, abdominal pain, fecal incontinence Genitourinary:  No dysuria, urinary retention or frequency Musculoskeletal:  No neck pain, back pain Integumentary: No rash, pruritus, skin lesions Neurological: as above Psychiatric: No depression, insomnia, anxiety Endocrine: No palpitations, fatigue, diaphoresis, mood swings, change in appetite, change in weight, increased thirst Hematologic/Lymphatic:  No purpura, petechiae. Allergic/Immunologic: no itchy/runny eyes, nasal congestion, recent allergic reactions, rashes  PHYSICAL EXAM: Vitals:   03/14/16 1253  BP: 122/74  Pulse: 98   General: No acute distress.  Patient appears well-groomed.  Head:  Normocephalic/atraumatic Eyes:  fundi examined but not visualized Neck: supple, no paraspinal tenderness, full range of motion Back: No paraspinal tenderness Heart: regular rate and rhythm Lungs: Clear to auscultation bilaterally. Vascular: No carotid bruits. Neurological Exam: Mental status: alert and oriented to person, place, and time, recent and remote memory intact, fund of knowledge intact, attention and concentration intact, speech fluent and not dysarthric, language intact. Cranial nerves: CN I: not tested CN II: pupils equal, round and reactive to light, visual fields intact CN III, IV, VI:   full range of motion, no nystagmus, no ptosis CN V: facial sensation intact CN VII: upper and lower face symmetric CN VIII: hearing intact CN IX, X: gag intact, uvula midline CN XI: sternocleidomastoid and trapezius muscles intact CN XII: tongue midline Bulk & Tone: normal, no fasciculations. Motor:  5/5 throughout  Sensation: temperature and vibration sensation intact. Deep Tendon Reflexes:  2+ throughout, toes downgoing.  Finger to nose testing:  Without dysmetria.  Heel to shin:  Without dysmetria.  Gait:  Normal station and stride.  Able to turn and tandem walk. Timed 25 foot walk 4.40 seconds.  Romberg negative.  IMPRESSION: I agree that he meets the criteria for relapsing-remitting multiple sclerosis.  Even though he is doing well, I explained that he should remain on disease modifying therapy in order to prevent further relapse and progression of disease.   I answered all questions to the best of my ability.  Thank you for allowing me to take part in the care of this patient.  Shon Millet, DO  CC: Evelena Peat, MD

## 2016-10-19 ENCOUNTER — Ambulatory Visit (HOSPITAL_COMMUNITY)
Admission: EM | Admit: 2016-10-19 | Discharge: 2016-10-19 | Disposition: A | Discharge status: Home / Self Care | Payer: BLUE CROSS/BLUE SHIELD | Attending: Physician Assistant | Admitting: Physician Assistant

## 2016-10-19 ENCOUNTER — Telehealth (HOSPITAL_COMMUNITY): Payer: Self-pay | Admitting: Emergency Medicine

## 2016-10-19 ENCOUNTER — Encounter (HOSPITAL_COMMUNITY): Payer: Self-pay | Admitting: *Deleted

## 2016-10-19 DIAGNOSIS — R21 Rash and other nonspecific skin eruption: Secondary | ICD-10-CM

## 2016-10-19 MED ORDER — DOXYCYCLINE HYCLATE 100 MG PO CAPS: 100.0000 mg | ORAL_CAPSULE | Freq: Two times a day (BID) | ORAL | 0 refills | Status: AC

## 2016-10-19 MED ORDER — DOXYCYCLINE HYCLATE 100 MG PO CAPS: 100.0000 mg | ORAL_CAPSULE | Freq: Two times a day (BID) | ORAL | 0 refills | Status: DC

## 2016-10-19 NOTE — ED Triage Notes (Signed)
Pt  Reports     Reports      Had    Some       sorethroat       Got  Better   Congestion   stuffyness   And  Headache     Symptoms   X  4  Days  Has   Rash      On  Face  Yesterday   Took   alkaseltzer     otc

## 2016-10-19 NOTE — ED Provider Notes (Addendum)
10/19/2016 7:43 PM   DOB: 09-Jan-1981 / MRN: [id number]  SUBJECTIVE:  Darren Knapp is a 36 y.o. male presenting for sore throat that started about 3 days ago.  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  Tells me the sore throat has improved. Then developed some "clogged ears" then started having a left sided headache and fine rash about the face that started yesterday.  The rash does not hurt or itch.   He has No Known Allergies.   He  has a past medical history of Arthritis; Hypertension; Multiple sclerosis (HCC) (2011); and Neuromuscular disorder (HCC).    He  reports that he has quit smoking. He has never used smokeless tobacco. He reports that he drinks about 4.2 oz of alcohol per week . He reports that he does not use drugs. He  has no sexual activity history on file. The patient  has no past surgical history on file.  His family history includes Heart disease in his maternal grandfather; Hypertension in his father and mother.  Review of Systems  Constitutional: Negative for chills, diaphoresis and fever.  Eyes: Negative.   Respiratory: Negative for cough, hemoptysis, sputum production, shortness of breath and wheezing.   Cardiovascular: Negative for chest pain, orthopnea and leg swelling.  Gastrointestinal: Negative for nausea.  Skin: Negative for rash.  Neurological: Negative for dizziness, sensory change, speech change, focal weakness and headaches.    OBJECTIVE:  BP (!) 142/84 (BP Location: Right Arm)   Pulse 82   Temp 98.6 F (37 C)   Resp 18   SpO2 100%   Physical Exam  Constitutional: He appears well-developed. He is active and cooperative.  Non-toxic appearance.  HENT:  Right Ear: Hearing, tympanic membrane, external ear and ear canal normal.  Left Ear: Hearing, tympanic membrane, external ear and ear canal normal.  Nose: Nose normal. Right sinus exhibits no maxillary sinus tenderness and no frontal sinus tenderness. Left sinus exhibits no maxillary sinus tenderness and no  frontal sinus tenderness.  Mouth/Throat: Uvula is midline, oropharynx is clear and moist and mucous membranes are normal. No oropharyngeal exudate, posterior oropharyngeal edema or tonsillar abscesses.  Eyes: Pupils are equal, round, and reactive to light. Conjunctivae are normal.  Cardiovascular: Normal rate, regular rhythm, S1 normal, S2 normal, normal heart sounds, intact distal pulses and normal pulses.  Exam reveals no gallop and no friction rub.   No murmur heard. Pulmonary/Chest: Effort normal. No stridor. No tachypnea. No respiratory distress. He has no wheezes. He has no rales.  Abdominal: He exhibits no distension.  Musculoskeletal: He exhibits no edema.  Lymphadenopathy:       Head (right side): No submandibular and no tonsillar adenopathy present.       Head (left side): No submandibular and no tonsillar adenopathy present.    He has no cervical adenopathy.  Neurological: He is alert.  Skin: Skin is warm and dry. Rash (very fine rash about the bilateral cheeks, nose, forehead, preauricular and post auricular skin. ) noted. He is not diaphoretic. No erythema. No pallor.  Vitals reviewed.   No results found for this or any previous visit (from the past 72 hour(s)).  No results found.  ASSESSMENT AND PLAN:  The encounter diagnosis was Rash and nonspecific skin eruption.  Odd rash and the best way I could describe would be miliary, painless and non pruritic.   He has a history of MS and I worry that this may somehow be a real change in his skin. I have given him doxy to  cover for an early folliculitis. A punch would be helpful.     The patient is advised to call or return to clinic if he does not see an improvement in symptoms, or to seek the care of the closest emergency department if he worsens with the above plan.   Deliah Boston, MHS, PA-C 10/19/2016 7:43 PM    Ofilia Neas, PA-C 10/19/16 1946    Ofilia Neas, PA-C 10/19/16 1949

## 2016-10-19 NOTE — Telephone Encounter (Signed)
Pt requested Rx to be sent to [facility] ([address])

## 2016-10-24 ENCOUNTER — Encounter: Payer: Self-pay | Admitting: Family Medicine

## 2016-10-24 ENCOUNTER — Ambulatory Visit (INDEPENDENT_AMBULATORY_CARE_PROVIDER_SITE_OTHER): Payer: BLUE CROSS/BLUE SHIELD | Admitting: Family Medicine

## 2016-10-24 VITALS — BP 122/82 | HR 88 | Temp 98.4°F | Wt 283.7 lb

## 2016-10-24 DIAGNOSIS — R21 Rash and other nonspecific skin eruption: Secondary | ICD-10-CM | POA: Diagnosis not present

## 2016-10-24 NOTE — Progress Notes (Signed)
Subjective:     Patient ID: Darren Knapp, male   DOB: 07-Dec-1980, 36 y.o.   MRN: [id number]  HPI Patient seen as a work in with skin rash which he first noted about a week ago. He states he had URI symptoms preceding this. He does try Saturday that he had some small "bumps "on his for head mostly. These are slightly pruritic. He described these initially as hives. He went  To urgent care prescribed doxycycline. He denies any rash on his extremities or trunk.: Symptoms are improving. No fever. No chills.  Past Medical History:  Diagnosis Date  . Arthritis   . Hypertension   . Multiple sclerosis (HCC) 2011  . Neuromuscular disorder (HCC)    No past surgical history on file.  reports that he has quit smoking. He has never used smokeless tobacco. He reports that he drinks about 4.2 oz of alcohol per week . He reports that he does not use drugs. family history includes Heart disease in his maternal grandfather; Hypertension in his father and mother. No Known Allergies   Review of Systems  Constitutional: Negative for chills and fever.  HENT: Negative for sore throat.   Respiratory: Negative for cough.   Skin: Positive for rash.       Objective:   Physical Exam  Constitutional: He appears well-developed and well-nourished.  HENT:  Right Ear: External ear normal.  Left Ear: External ear normal.  Mouth/Throat: Oropharynx is clear and moist.  Cardiovascular: Normal rate and regular rhythm.   Pulmonary/Chest: Effort normal and breath sounds normal. No respiratory distress. He has no wheezes. He has no rales.  Musculoskeletal: He exhibits no edema.  Skin:  Patient has a very faint slightly raised papules on forehead. No pustules. Nonscaly. Nontender. These are diffuse and very faint and difficult to see       Assessment:     Faint rash on the face. These do not appear typical for hives. Question post viral rash    Plan:     -Reassurance -Consider trial of over-the-counter  antihistamine such as Zyrtec, Allegra, or Xyzal  Kristian Covey MD [facility] at [facility]

## 2016-10-24 NOTE — Patient Instructions (Signed)
Consider OTC anti-histamine such as Allegra, Zyrtec, or Xyzal.

## 2016-11-11 ENCOUNTER — Ambulatory Visit (HOSPITAL_COMMUNITY)
Admission: EM | Admit: 2016-11-11 | Discharge: 2016-11-11 | Disposition: A | Discharge status: Home / Self Care | Payer: BLUE CROSS/BLUE SHIELD | Attending: Emergency Medicine | Admitting: Emergency Medicine

## 2016-11-11 ENCOUNTER — Encounter (HOSPITAL_COMMUNITY): Payer: Self-pay | Admitting: Emergency Medicine

## 2016-11-11 DIAGNOSIS — K648 Other hemorrhoids: Secondary | ICD-10-CM | POA: Diagnosis not present

## 2016-11-11 MED ORDER — HYDROCORTISONE ACETATE 25 MG RE SUPP: 25.0000 mg | Freq: Two times a day (BID) | RECTAL | 0 refills | Status: DC

## 2016-11-11 NOTE — Discharge Instructions (Signed)
Recommend using the suppositories as directed. May want to apply Vaseline before or after a bowel movement. Stool softener such as Colace 2-3 times a day. No straining. High-fiber diet. If you develop increased bleeding, increased pain or size of the hemorrhoid will need to seek medical attention.

## 2016-11-11 NOTE — ED Triage Notes (Signed)
Pt states he had a BM that he had to strain for last Wednesday.  He states his BM was hard and dry.  He reports seeing some blood on his tissue when he wiped.  He reports softer BM's since then but he is still seeing blood on the tissue and he reports anal pain throughout the day.

## 2016-11-11 NOTE — ED Provider Notes (Signed)
[facility]    CSN: [id number] Arrival date & time: 11/11/16  1044     History   Chief Complaint Chief Complaint  Patient presents with  . Rectal Pain    HPI Darren Knapp is a 36 y.o. male.   36 year old male complaining of anal pain. As per nurse's note he was having some hard stool 1 week ago and was straining. He developed rectal pain at that time. He had a bowel movement the following day with some pain and noticed that while cleaning he had a scant amount of blood only tissue. No blood noticed in the stool or toilet. He continues to have some discomfort particularly with wiping with a scant amount of blood on the tissue. Denies persistent or continuous rectal bleeding. He states the anal area is tender and a little raw.      Past Medical History:  Diagnosis Date  . Arthritis   . Hypertension   . Multiple sclerosis (HCC) 2011  . Neuromuscular disorder [facility])     Patient Active Problem List   Diagnosis Date Noted  . Multiple sclerosis (HCC) 03/09/2015  . Alcohol abuse 03/09/2015  . ACUTE TRANSVERSE MYELITIS NOS 07/27/2008  . HYPERTENSION 07/27/2008    History reviewed. No pertinent surgical history.     Home Medications    Prior to Admission medications   Medication Sig Start Date End Date Taking? Authorizing Provider  glucosamine-chondroitin 500-400 MG tablet Take 1 tablet by mouth 3 (three) times daily.   Yes [provider]  vitamin C (ASCORBIC ACID) 500 MG tablet Take 500 mg by mouth daily.   Yes [provider]  VITAMIN E PO Take 1 capsule by mouth daily.   Yes [provider]  hydrocortisone (ANUSOL-HC) 25 MG suppository Place 1 suppository (25 mg total) rectally 2 (two) times daily. 11/11/16   Hayden Rasmussen, NP    Family History Family History  Problem Relation Age of Onset  . Hypertension Mother   . Hypertension Father   . Heart disease Maternal Grandfather     Social History Social History  Substance Use  Topics  . Smoking status: Former Games developer  . Smokeless tobacco: Never Used  . Alcohol use 4.2 oz/week    7 Cans of beer per week     Allergies   Patient has no known allergies.   Review of Systems Review of Systems  Constitutional: Negative.   Gastrointestinal: Positive for anal bleeding and rectal pain.  Genitourinary: Negative.   Psychiatric/Behavioral: Negative.   All other systems reviewed and are negative.    Physical Exam Triage Vital Signs ED Triage Vitals [11/11/16 1123]  Enc Vitals Group     BP (!) 151/101     Pulse Rate 78     Resp      Temp 98.5 F (36.9 C)     Temp Source Oral     SpO2 99 %     Weight      Height      Head Circumference      Peak Flow      Pain Score 6     Pain Loc      Pain Edu?      Excl. in GC?    No data found.   Updated Vital Signs BP (!) 151/101 (BP Location: Left Wrist)   Pulse 78   Temp 98.5 F (36.9 C) (Oral)   SpO2 99%   Visual Acuity Right Eye Distance:   Left Eye Distance:  Bilateral Distance:    Right Eye Near:   Left Eye Near:    Bilateral Near:     Physical Exam  Constitutional: He is oriented to person, place, and time. He appears well-developed and well-nourished. No distress.  Eyes: EOM are normal.  Neck: Normal range of motion. Neck supple.  Cardiovascular: Normal rate.   Pulmonary/Chest: Effort normal. No respiratory distress.  Abdominal: There is no tenderness.  Genitourinary:  Genitourinary Comments: Rectal exam without evidence of external hemorrhoid. The anal ring with a small tender cordlike structure. No evidence of thrombosis. No strictures or obstruction. Minor erythematous small area just over the hemorrhoid. No evidence of bleeding.  Musculoskeletal: He exhibits no edema.  Neurological: He is alert and oriented to person, place, and time. He exhibits normal muscle tone.  Skin: Skin is warm and dry.  Psychiatric: He has a normal mood and affect.  Nursing note and vitals  reviewed.    UC Treatments / Results  Labs (all labs ordered are listed, but only abnormal results are displayed) Labs Reviewed - No data to display  EKG  EKG Interpretation None       Radiology No results found.  Procedures Procedures (including critical care time)  Medications Ordered in UC Medications - No data to display   Initial Impression / Assessment and Plan / UC Course  I have reviewed the triage vital signs and the nursing notes.  Pertinent labs & imaging results that were available during my care of the patient were reviewed by me and considered in my medical decision making (see chart for details).    Recommend using the suppositories as directed. May want to apply Vaseline before or after a bowel movement. Stool softener such as Colace 2-3 times a day. No straining. High-fiber diet. If you develop increased bleeding, increased pain or size of the hemorrhoid will need to seek medical attention.     Final Clinical Impressions(s) / UC Diagnoses   Final diagnoses:  Internal hemorrhoid    New Prescriptions New Prescriptions   HYDROCORTISONE (ANUSOL-HC) 25 MG SUPPOSITORY    Place 1 suppository (25 mg total) rectally 2 (two) times daily.     Controlled Substance Prescriptions [place] Controlled Substance Registry consulted? Not Applicable   Hayden Rasmussen, NP 11/11/16 1217

## 2016-11-17 ENCOUNTER — Ambulatory Visit (INDEPENDENT_AMBULATORY_CARE_PROVIDER_SITE_OTHER): Payer: BLUE CROSS/BLUE SHIELD | Admitting: Family Medicine

## 2016-11-17 ENCOUNTER — Encounter: Payer: Self-pay | Admitting: Family Medicine

## 2016-11-17 VITALS — BP 120/88 | HR 86 | Temp 98.4°F | Wt 287.3 lb

## 2016-11-17 DIAGNOSIS — K602 Anal fissure, unspecified: Secondary | ICD-10-CM

## 2016-11-17 MED ORDER — DILTIAZEM GEL 2 %: 1.0000 "application " | Freq: Three times a day (TID) | CUTANEOUS | 1 refills | Status: DC | PRN

## 2016-11-17 NOTE — Patient Instructions (Signed)
Anal Fissure, Adult An anal fissure is a small tear or crack in the skin around the anus. Bleeding from a fissure usually stops on its own within a few minutes. However, bleeding will often occur again with each bowel movement until the crack heals. What are the causes? This condition may be caused by:  Passing large, hard stool (feces).  Frequent diarrhea.  Constipation.  Inflammatory bowel disease (Crohn disease or ulcerative colitis).  Infections.  Anal sex.  What are the signs or symptoms? Symptoms of this condition include:  Bleeding from the rectum.  Small amounts of blood seen on your stool, on toilet paper, or in the toilet after a bowel movement.  Painful bowel movements.  Itching or irritation around the anus.  How is this diagnosed? A health care provider may diagnose this condition by closely examining the anal area. An anal fissure can usually be seen with careful inspection. In some cases, a rectal exam may be performed, or a short tube (anoscope) may be used to examine the anal canal. How is this treated? Treatment for this condition may include:  Taking steps to avoid constipation. This may include making changes to your diet, such as increasing your intake of fiber or fluid.  Taking fiber supplements. These supplements can soften your stool to help make bowel movements easier. Your health care provider may also prescribe a stool softener if your stool is often hard.  Taking sitz baths. This may help to heal the tear.  Using medicated creams or ointments. These may be prescribed to lessen discomfort.  Follow these instructions at home: Eating and drinking  Avoid foods that may be constipating, such as bananas and dairy products.  Drink enough fluid to keep your urine clear or pale yellow.  Maintain a diet that is high in fruits, whole grains, and vegetables. General instructions  Keep the anal area as clean and dry as possible.  Take sitz baths as  told by your health care provider. Do not use soap in the sitz baths.  Take over-the-counter and prescription medicines only as told by your health care provider.  Use creams or ointments only as told by your health care provider.  Keep all follow-up visits as told by your health care provider. This is important. Contact a health care provider if:  You have more bleeding.  You have a fever.  You have diarrhea that is mixed with blood.  You continue to have pain.  Your problem is getting worse rather than better. This information is not intended to replace advice given to you by your health care provider. Make sure you discuss any questions you have with your health care provider. Document Released: 02/03/2005 Document Revised: 06/13/2015 Document Reviewed: 05/01/2014 Elsevier Interactive Patient Education  2018 [employer].  Consider warm sitz baths once or twice daily for the next couple weeks Drink plenty of fluids Try to get 25-30 g of fiber per day Consider stool softener such as Colace 2 daily But me know if this is not healing in the next couple weeks Consider Vaseline to keep area from drying /cracking

## 2016-11-17 NOTE — Progress Notes (Signed)
Subjective:     Patient ID: Darren Knapp, male   DOB: [birth year], 36 y.o.   MRN: [id number]  HPI Patient seen with bright red blood per rectum past couple weeks. He initially noticed some constipation. None since then. Said some pain with stools sometimes 8 out of 10 severity but not between stools. Is stools were bright blood with this. We warned the told not in the stool. Bleeding is been intermittent. No appetite or weight changes. What urgent care last week and was diagnosed with "hemorrhoids". He was prescribed hydrocortisone suppository but this was not covered by his insurance. No abdominal pain. No diarrhea.  Past Medical History:  Diagnosis Date  . Arthritis   . Hypertension   . Multiple sclerosis (HCC) 2011  . Neuromuscular disorder (HCC)    No past surgical history on file.  reports that he has quit smoking. He has never used smokeless tobacco. He reports that he drinks about 4.2 oz of alcohol per week . He reports that he does not use drugs. family history includes Heart disease in his maternal grandfather; Hypertension in his father and mother. Allergies  Allergen Reactions  . Glatiramer Anxiety and Shortness Of Breath    Pressure behind his head  . Iodine-131 Nausea Only     Review of Systems  Constitutional: Negative for appetite change and unexpected weight change.  Respiratory: Negative for shortness of breath.   Gastrointestinal: Positive for blood in stool. Negative for abdominal pain, diarrhea, nausea and vomiting.  Neurological: Negative for dizziness.       Objective:   Physical Exam  Constitutional: He appears well-developed and well-nourished.  Cardiovascular: Normal rate and regular rhythm.   Pulmonary/Chest: Effort normal and breath sounds normal. No respiratory distress. He has no wheezes. He has no rales.  Genitourinary:  Genitourinary Comments: No external hemorrhoids. Patient has fissure 12:00 position with no active bleeding.       Assessment:     Anal fissure around 12:00 position    Plan:     -Recommend warm sitz baths several times daily -25-30 g of fiber per day -Consider stool softener such as Colace 100 mg two daily -We discussed medications for pain such as diltiazem or nitroglycerin gel diluted but he at this point wishes to wait and observe -Touch base in 2 weeks if not resolving  Kristian Covey MD [facility] at [facility]

## 2016-12-05 ENCOUNTER — Telehealth: Payer: Self-pay

## 2016-12-05 DIAGNOSIS — K649 Unspecified hemorrhoids: Secondary | ICD-10-CM

## 2016-12-05 NOTE — Telephone Encounter (Signed)
Left message on machine for patient to return our call 

## 2016-12-05 NOTE — Telephone Encounter (Signed)
Use some plain Vaseline to reduce cracking around the area of the fissure and also suggest that he consider over-the-counter MiraLAX once daily as needed if still straining.

## 2016-12-05 NOTE — Telephone Encounter (Signed)
Patient called to report that he has been trying to eat more fiber. He has not had much success in relieving his constipation and when he had a bm last night he had more bleeding from the fissure. He was not able to get the gel that was rx'd as it was not covered by his insurance. Advised him to contact his insurance company to find out what they will cover instead. Also advised him to add in more fruit and vegetables that have high natural fiber, add in prunes or prune juice and he can try an OTC fiber powder supplement. He would like to know what else you might recommend or what his next options would be.  Dr. Caryl Never - Please advise. Thanks!

## 2016-12-09 NOTE — Telephone Encounter (Signed)
Attempted to call patient but unable to leave a message due to the mailbox not being set up yet. Will try again at a different time.

## 2016-12-16 NOTE — Telephone Encounter (Signed)
Patient is requesting a referral to GI.  Okay to refer?   Patient aware that Dr Caryl Never is out of the office this week.

## 2016-12-18 NOTE — Telephone Encounter (Signed)
Ok to refer.

## 2016-12-19 ENCOUNTER — Encounter: Payer: Self-pay | Admitting: Internal Medicine

## 2016-12-19 NOTE — Addendum Note (Signed)
Addended by: Kern ReapVEREEN, Basem Yannuzzi B on: 12/19/2016 08:08 AM   Modules accepted: Orders

## 2016-12-26 ENCOUNTER — Ambulatory Visit (INDEPENDENT_AMBULATORY_CARE_PROVIDER_SITE_OTHER): Payer: BLUE CROSS/BLUE SHIELD | Admitting: Family Medicine

## 2016-12-30 NOTE — Progress Notes (Signed)
Pt not seen.

## 2017-01-02 ENCOUNTER — Ambulatory Visit (INDEPENDENT_AMBULATORY_CARE_PROVIDER_SITE_OTHER): Payer: BLUE CROSS/BLUE SHIELD | Admitting: Family Medicine

## 2017-01-02 ENCOUNTER — Encounter: Payer: Self-pay | Admitting: Family Medicine

## 2017-01-02 VITALS — BP 124/90 | HR 93 | Temp 98.2°F | Wt 290.0 lb

## 2017-01-02 DIAGNOSIS — Z0000 Encounter for general adult medical examination without abnormal findings: Secondary | ICD-10-CM

## 2017-01-02 LAB — CBC WITH DIFFERENTIAL/PLATELET
Basophils Absolute: 0 10*3/uL (ref 0.0–0.1)
Basophils Relative: 0.6 % (ref 0.0–3.0)
Eosinophils Absolute: 0.1 10*3/uL (ref 0.0–0.7)
Eosinophils Relative: 2.4 % (ref 0.0–5.0)
HCT: 42.9 % (ref 39.0–52.0)
Hemoglobin: 14.4 g/dL (ref 13.0–17.0)
Lymphocytes Relative: 42.2 % (ref 12.0–46.0)
Lymphs Abs: 1.5 10*3/uL (ref 0.7–4.0)
MCHC: 33.6 g/dL (ref 30.0–36.0)
MCV: 93.7 fl (ref 78.0–100.0)
Monocytes Absolute: 0.3 10*3/uL (ref 0.1–1.0)
Monocytes Relative: 7.9 % (ref 3.0–12.0)
Neutro Abs: 1.7 10*3/uL (ref 1.4–7.7)
Neutrophils Relative %: 46.9 % (ref 43.0–77.0)
Platelets: 291 10*3/uL (ref 150.0–400.0)
RBC: 4.58 Mil/uL (ref 4.22–5.81)
RDW: 12.9 % (ref 11.5–15.5)
WBC: 3.6 10*3/uL — ABNORMAL LOW (ref 4.0–10.5)

## 2017-01-02 LAB — TSH: TSH: 1.44 u[IU]/mL (ref 0.35–4.50)

## 2017-01-02 LAB — LIPID PANEL
CHOLESTEROL: 236 mg/dL — AB (ref 0–200)
HDL: 43.3 mg/dL (ref >39.00)
LDL CALC: 175 mg/dL — AB (ref 0–99)
NonHDL: 192.7
Total CHOL/HDL Ratio: 5
Triglycerides: 90 mg/dL (ref 0.0–149.0)
VLDL: 18 mg/dL (ref 0.0–40.0)

## 2017-01-02 LAB — HEPATIC FUNCTION PANEL
ALT: 23 U/L (ref 0–53)
AST: 18 U/L (ref 0–37)
Albumin: 4.7 g/dL (ref 3.5–5.2)
Alkaline Phosphatase: 52 U/L (ref 39–117)
BILIRUBIN DIRECT: 0.1 mg/dL (ref 0.0–0.3)
BILIRUBIN TOTAL: 0.7 mg/dL (ref 0.2–1.2)
TOTAL PROTEIN: 7.2 g/dL (ref 6.0–8.3)

## 2017-01-02 LAB — BASIC METABOLIC PANEL
BUN: 20 mg/dL (ref 6–23)
CO2: 30 mEq/L (ref 19–32)
CREATININE: 0.91 mg/dL (ref 0.40–1.50)
Calcium: 10 mg/dL (ref 8.4–10.5)
Chloride: 101 mEq/L (ref 96–112)
GFR: 121.1 mL/min (ref >60.00)
Glucose, Bld: 104 mg/dL — ABNORMAL HIGH (ref 70–99)
Potassium: 4 mEq/L (ref 3.5–5.1)
Sodium: 138 mEq/L (ref 135–145)

## 2017-01-02 NOTE — Progress Notes (Signed)
Subjective:     Patient ID: Darren Knapp, male   DOB: 01-Jul-1980, 36 y.o.   MRN: [id number]  HPI Patient seen for physical exam. He has battled with alcohol abuse in the past and had relapsed about a month ago but has been clean since then. He has had history with AA in the past and is getting back involved with them. He has felt much better over the past month since abstinence. He had borderline elevated blood pressure the past. History of obesity. No history of diabetes. Declines flu vaccination.  Patient is married and works for [employer] full time.  Recent anal fissure which is not bleeding currently. He is using stool softeners not noted any blood past couple weeks. He requested referral to GI and this appointment is pending for December  Past Medical History:  Diagnosis Date  . Arthritis   . Hypertension   . Multiple sclerosis (HCC) 2011  . Neuromuscular disorder (HCC)    No past surgical history on file.  reports that he has quit smoking. he has never used smokeless tobacco. He reports that he drinks about 4.2 oz of alcohol per week. He reports that he does not use drugs. family history includes Heart disease in his maternal grandfather; Hypertension in his father and mother. Allergies  Allergen Reactions  . Glatiramer Anxiety and Shortness Of Breath    Pressure behind his head  . Iodine-131 Nausea Only     Review of Systems  Constitutional: Negative for activity change, appetite change, fatigue and fever.  HENT: Negative for congestion, ear pain and trouble swallowing.   Eyes: Negative for pain and visual disturbance.  Respiratory: Negative for cough, shortness of breath and wheezing.   Cardiovascular: Negative for chest pain and palpitations.  Gastrointestinal: Negative for abdominal distention, abdominal pain, blood in stool, constipation, diarrhea, nausea, rectal pain and vomiting.  Genitourinary: Negative for dysuria, hematuria and testicular pain.  Musculoskeletal: Negative  for arthralgias and joint swelling.  Skin: Negative for rash.  Neurological: Negative for dizziness, syncope and headaches.  Hematological: Negative for adenopathy.  Psychiatric/Behavioral: Negative for confusion and dysphoric mood.       Objective:   Physical Exam  Constitutional: He is oriented to person, place, and time. He appears well-developed and well-nourished. No distress.  HENT:  Head: Normocephalic and atraumatic.  Right Ear: External ear normal.  Left Ear: External ear normal.  Mouth/Throat: Oropharynx is clear and moist.  Eyes: Conjunctivae and EOM are normal. Pupils are equal, round, and reactive to light.  Neck: Normal range of motion. Neck supple. No thyromegaly present.  Cardiovascular: Normal rate, regular rhythm and normal heart sounds.  No murmur heard. Pulmonary/Chest: No respiratory distress. He has no wheezes. He has no rales.  Abdominal: Soft. Bowel sounds are normal. He exhibits no distension and no mass. There is no tenderness. There is no rebound and no guarding.  Musculoskeletal: He exhibits no edema.  Lymphadenopathy:    He has no cervical adenopathy.  Neurological: He is alert and oriented to person, place, and time. He displays normal reflexes. No cranial nerve deficit.  Skin: No rash noted.  Psychiatric: He has a normal mood and affect.       Assessment:     Complete physical. History of alcohol abuse. Several issues addressed as below     Plan:     -Flu vaccine declined  -We discussed ongoing support programs for abstinence from alcohol and he is currently involved with AA  -Obtain screening labs  -Encouraged  to lose some weight  -Patient questions regarding potential adult ADD. We have recommended referral to our clinical psychologist for further testing and he will set this up   Kristian CoveyBruce W Lorrie Gargan MD [facility]

## 2017-01-30 ENCOUNTER — Ambulatory Visit: Payer: BLUE CROSS/BLUE SHIELD | Admitting: Internal Medicine

## 2017-02-24 ENCOUNTER — Encounter: Payer: Self-pay | Admitting: Family Medicine

## 2017-03-20 ENCOUNTER — Ambulatory Visit (INDEPENDENT_AMBULATORY_CARE_PROVIDER_SITE_OTHER): Payer: BLUE CROSS/BLUE SHIELD | Admitting: Family Medicine

## 2017-03-20 ENCOUNTER — Encounter: Payer: Self-pay | Admitting: Family Medicine

## 2017-03-20 VITALS — BP 110/80 | HR 70 | Temp 97.9°F | Ht 72.25 in | Wt 300.7 lb

## 2017-03-20 DIAGNOSIS — J069 Acute upper respiratory infection, unspecified: Secondary | ICD-10-CM

## 2017-03-20 NOTE — Patient Instructions (Signed)
Upper Respiratory Infection, Adult Most upper respiratory infections (URIs) are a viral infection of the air passages leading to the lungs. A URI affects the nose, throat, and upper air passages. The most common type of URI is nasopharyngitis and is typically referred to as "the common cold." URIs run their course and usually go away on their own. Most of the time, a URI does not require medical attention, but sometimes a bacterial infection in the upper airways can follow a viral infection. This is called a secondary infection. Sinus and middle ear infections are common types of secondary upper respiratory infections. Bacterial pneumonia can also complicate a URI. A URI can worsen asthma and chronic obstructive pulmonary disease (COPD). Sometimes, these complications can require emergency medical care and may be life threatening. What are the causes? Almost all URIs are caused by viruses. A virus is a type of germ and can spread from one person to another. What increases the risk? You may be at risk for a URI if:  You smoke.  You have chronic heart or lung disease.  You have a weakened defense (immune) system.  You are very young or very old.  You have nasal allergies or asthma.  You work in crowded or poorly ventilated areas.  You work in health care facilities or schools.  What are the signs or symptoms? Symptoms typically develop 2-3 days after you come in contact with a cold virus. Most viral URIs last 7-10 days. However, viral URIs from the influenza virus (flu virus) can last 14-18 days and are typically more severe. Symptoms may include:  Runny or stuffy (congested) nose.  Sneezing.  Cough.  Sore throat.  Headache.  Fatigue.  Fever.  Loss of appetite.  Pain in your forehead, behind your eyes, and over your cheekbones (sinus pain).  Muscle aches.  How is this diagnosed? Your health care provider may diagnose a URI by:  Physical exam.  Tests to check that your  symptoms are not due to another condition such as: ? Strep throat. ? Sinusitis. ? Pneumonia. ? Asthma.  How is this treated? A URI goes away on its own with time. It cannot be cured with medicines, but medicines may be prescribed or recommended to relieve symptoms. Medicines may help:  Reduce your fever.  Reduce your cough.  Relieve nasal congestion.  Follow these instructions at home:  Take medicines only as directed by your health care provider.  Gargle warm saltwater or take cough drops to comfort your throat as directed by your health care provider.  Use a warm mist humidifier or inhale steam from a shower to increase air moisture. This may make it easier to breathe.  Drink enough fluid to keep your urine clear or pale yellow.  Eat soups and other clear broths and maintain good nutrition.  Rest as needed.  Return to work when your temperature has returned to normal or as your health care provider advises. You may need to stay home longer to avoid infecting others. You can also use a face mask and careful hand washing to prevent spread of the virus.  Increase the usage of your inhaler if you have asthma.  Do not use any tobacco products, including cigarettes, chewing tobacco, or electronic cigarettes. If you need help quitting, ask your health care provider. How is this prevented? The best way to protect yourself from getting a cold is to practice good hygiene.  Avoid oral or hand contact with people with cold symptoms.  Wash your   hands often if contact occurs.  There is no clear evidence that vitamin C, vitamin E, echinacea, or exercise reduces the chance of developing a cold. However, it is always recommended to get plenty of rest, exercise, and practice good nutrition. Contact a health care provider if:  You are getting worse rather than better.  Your symptoms are not controlled by medicine.  You have chills.  You have worsening shortness of breath.  You have  brown or red mucus.  You have yellow or brown nasal discharge.  You have pain in your face, especially when you bend forward.  You have a fever.  You have swollen neck glands.  You have pain while swallowing.  You have white areas in the back of your throat. Get help right away if:  You have severe or persistent: ? Headache. ? Ear pain. ? Sinus pain. ? Chest pain.  You have chronic lung disease and any of the following: ? Wheezing. ? Prolonged cough. ? Coughing up blood. ? A change in your usual mucus.  You have a stiff neck.  You have changes in your: ? Vision. ? Hearing. ? Thinking. ? Mood. This information is not intended to replace advice given to you by your health care provider. Make sure you discuss any questions you have with your health care provider. Document Released: 07/30/2000 Document Revised: 10/07/2015 Document Reviewed: 05/11/2013 Elsevier Interactive Patient Education  2018 [employer].  Consider OTC Sudafed for nasal congestion.

## 2017-03-20 NOTE — Progress Notes (Signed)
Subjective:     Patient ID: Darren Knapp, male   DOB: 1980-10-23, 37 y.o.   MRN: [id number]  HPI Patient seen with onset a week ago of nasal congestion and body aches and some increased malaise. He has not had any sore throat, fever, or cough. He still worked all this week. His nasal congestion is worse at night and early in the morning. Denies any nausea, vomiting, or diarrhea. Overall, feels slightly improved today  Past Medical History:  Diagnosis Date  . Arthritis   . Hypertension   . Multiple sclerosis (HCC) 2011  . Neuromuscular disorder (HCC)    History reviewed. No pertinent surgical history.  reports that he has quit smoking. he has never used smokeless tobacco. He reports that he drinks about 4.2 oz of alcohol per week. He reports that he does not use drugs. family history includes Heart disease in his maternal grandfather; Hypertension in his father and mother. Allergies  Allergen Reactions  . Glatiramer Anxiety and Shortness Of Breath    Pressure behind his head  . Iodine-131 Nausea Only     Review of Systems  Constitutional: Positive for fatigue.  HENT: Positive for congestion, rhinorrhea and sinus pressure. Negative for sore throat.   Respiratory: Negative for cough.        Objective:   Physical Exam  Constitutional: He appears well-developed and well-nourished.  HENT:  Right Ear: External ear normal.  Left Ear: External ear normal.  Mouth/Throat: Oropharynx is clear and moist. No oropharyngeal exudate.  Neck: Neck supple.  Cardiovascular: Normal rate and regular rhythm.  Pulmonary/Chest: Effort normal and breath sounds normal. No respiratory distress. He has no wheezes. He has no rales.  Lymphadenopathy:    He has no cervical adenopathy.       Assessment:     Probable viral URI    Plan:     -Reassurance. -Symptomatic treatment with plenty fluids and rest and consider over-the-counter decongestant as needed  Kristian Covey MD [facility]  at [facility]

## 2017-07-24 ENCOUNTER — Ambulatory Visit (INDEPENDENT_AMBULATORY_CARE_PROVIDER_SITE_OTHER): Payer: BLUE CROSS/BLUE SHIELD | Admitting: Family Medicine

## 2017-07-24 ENCOUNTER — Encounter: Payer: Self-pay | Admitting: Family Medicine

## 2017-07-24 VITALS — BP 130/90 | HR 63 | Temp 98.3°F | Wt 297.3 lb

## 2017-07-24 DIAGNOSIS — R42 Dizziness and giddiness: Secondary | ICD-10-CM | POA: Diagnosis not present

## 2017-07-24 NOTE — Patient Instructions (Signed)

## 2017-07-24 NOTE — Progress Notes (Signed)
  Subjective:     Patient ID: Darren Knapp, male   DOB: March 28, 1980, 37 y.o.   MRN: [id number]  HPI Patient seen with chief dizziness for the past couple weeks. Had some initial difficulty describing but apparently this is more like vertigo and triggered with head movement. He thinks this is directional to the left.  No nausea or vomiting. First episode occurred at work.  No hearing changes. No consistent headaches. Patient does have history of MS. He has scheduled follow-up with neurology next week and apparently has scheduled MRI the brain week from today  No prior history of vertigo. No palpitations. No syncope . No lightheadedness.  Past Medical History:  Diagnosis Date  . Arthritis   . Hypertension   . Multiple sclerosis (HCC) 2011  . Neuromuscular disorder (HCC)    No past surgical history on file.  reports that he has quit smoking. He has never used smokeless tobacco. He reports that he drinks about 4.2 oz of alcohol per week. He reports that he does not use drugs. family history includes Heart disease in his maternal grandfather; Hypertension in his father and mother. Allergies  Allergen Reactions  . Glatiramer Anxiety and Shortness Of Breath    Pressure behind his head  . Iodine-131 Nausea Only     Review of Systems  Constitutional: Negative for chills, fatigue, fever and unexpected weight change.  Eyes: Negative for visual disturbance.  Respiratory: Negative for cough, chest tightness and shortness of breath.   Cardiovascular: Negative for chest pain, palpitations and leg swelling.  Neurological: Positive for dizziness. Negative for seizures, syncope, speech difficulty, weakness, light-headedness and headaches.       Objective:   Physical Exam  Constitutional: He is oriented to person, place, and time. He appears well-developed and well-nourished.  Cardiovascular: Normal rate and regular rhythm.  Pulmonary/Chest: Effort normal and breath sounds normal. No respiratory  distress.  Neurological: He is alert and oriented to person, place, and time. No cranial nerve deficit.  Positive Dix-Hallpike maneuver to the left-but symptoms relatively mild       Assessment:     Dizziness. Symptoms sound more compatible with vertigo. Suspect benign peripheral positional vertigo to the left    Plan:     -Discuss trial Epley maneuvers to left with handout given -We recommended follow-up or touch base within one week if symptoms not fully resolving. Patient has follow-up with his neurologist next week and is encouraged to discuss with them symptoms if not resolving  Kristian Covey MD [facility] at [facility]

## 2018-02-26 ENCOUNTER — Encounter: Payer: BLUE CROSS/BLUE SHIELD | Admitting: Family Medicine

## 2018-03-12 ENCOUNTER — Encounter: Payer: Self-pay | Admitting: Family Medicine

## 2018-03-12 ENCOUNTER — Other Ambulatory Visit: Payer: Self-pay

## 2018-03-12 ENCOUNTER — Ambulatory Visit (INDEPENDENT_AMBULATORY_CARE_PROVIDER_SITE_OTHER): Payer: BLUE CROSS/BLUE SHIELD | Admitting: Family Medicine

## 2018-03-12 VITALS — BP 132/80 | HR 67 | Temp 98.1°F | Ht 72.0 in | Wt 290.3 lb

## 2018-03-12 DIAGNOSIS — Z0000 Encounter for general adult medical examination without abnormal findings: Secondary | ICD-10-CM

## 2018-03-12 LAB — BASIC METABOLIC PANEL
BUN: 17 mg/dL (ref 6–23)
CALCIUM: 10 mg/dL (ref 8.4–10.5)
CO2: 30 meq/L (ref 19–32)
CREATININE: 0.99 mg/dL (ref 0.40–1.50)
Chloride: 100 mEq/L (ref 96–112)
GFR: 102.7 mL/min (ref >60.00)
GLUCOSE: 92 mg/dL (ref 70–99)
Potassium: 4.5 mEq/L (ref 3.5–5.1)
Sodium: 139 mEq/L (ref 135–145)

## 2018-03-12 LAB — LIPID PANEL
Cholesterol: 241 mg/dL — ABNORMAL HIGH (ref 0–200)
HDL: 49.7 mg/dL (ref >39.00)
LDL Cholesterol: 157 mg/dL — ABNORMAL HIGH (ref 0–99)
NONHDL: 190.84
Total CHOL/HDL Ratio: 5
Triglycerides: 168 mg/dL — ABNORMAL HIGH (ref 0.0–149.0)
VLDL: 33.6 mg/dL (ref 0.0–40.0)

## 2018-03-12 LAB — HEPATIC FUNCTION PANEL
ALBUMIN: 4.6 g/dL (ref 3.5–5.2)
ALT: 37 U/L (ref 0–53)
AST: 23 U/L (ref 0–37)
Alkaline Phosphatase: 48 U/L (ref 39–117)
Bilirubin, Direct: 0.1 mg/dL (ref 0.0–0.3)
Total Bilirubin: 0.7 mg/dL (ref 0.2–1.2)
Total Protein: 7 g/dL (ref 6.0–8.3)

## 2018-03-12 LAB — CBC WITH DIFFERENTIAL/PLATELET
BASOS PCT: 0.6 % (ref 0.0–3.0)
Basophils Absolute: 0 10*3/uL (ref 0.0–0.1)
EOS PCT: 2.6 % (ref 0.0–5.0)
Eosinophils Absolute: 0.1 10*3/uL (ref 0.0–0.7)
HCT: 40.3 % (ref 39.0–52.0)
HEMOGLOBIN: 13.7 g/dL (ref 13.0–17.0)
LYMPHS PCT: 36.3 % (ref 12.0–46.0)
Lymphs Abs: 1.7 10*3/uL (ref 0.7–4.0)
MCHC: 33.9 g/dL (ref 30.0–36.0)
MCV: 94.1 fl (ref 78.0–100.0)
Monocytes Absolute: 0.4 10*3/uL (ref 0.1–1.0)
Monocytes Relative: 8.5 % (ref 3.0–12.0)
Neutro Abs: 2.4 10*3/uL (ref 1.4–7.7)
Neutrophils Relative %: 52 % (ref 43.0–77.0)
Platelets: 297 10*3/uL (ref 150.0–400.0)
RBC: 4.28 Mil/uL (ref 4.22–5.81)
RDW: 13 % (ref 11.5–15.5)
WBC: 4.7 10*3/uL (ref 4.0–10.5)

## 2018-03-12 LAB — TSH: TSH: 1.39 u[IU]/mL (ref 0.35–4.50)

## 2018-03-12 NOTE — Progress Notes (Signed)
Subjective:     Patient ID: Darren Knapp, male   DOB: 08/23/1980, 38 y.o.   MRN: [id number]  HPI Patient is seen for physical exam.  He is undergoing separation currently from his wife and that has been naturally very stressful.  They have sought out some counseling.  He has diagnosis of MS which was diagnosed about 8 years ago.  He gets injections every 6 months.  He has strong family history of hypertension both parents.  He declines flu vaccines.  He had tetanus 2016  Past Medical History:  Diagnosis Date  . Arthritis   . Hypertension   . Multiple sclerosis (HCC) 2011  . Neuromuscular disorder (HCC)    History reviewed. No pertinent surgical history.  reports that he has quit smoking. He has never used smokeless tobacco. He reports current alcohol use of about 7.0 standard drinks of alcohol per week. He reports that he does not use drugs. family history includes Heart disease in his maternal grandfather; Hypertension in his father and mother. Allergies  Allergen Reactions  . Glatiramer Anxiety and Shortness Of Breath    Pressure behind his head  . Iodine-131 Nausea Only     Review of Systems  Constitutional: Negative for activity change, appetite change, fatigue and fever.  HENT: Negative for congestion, ear pain and trouble swallowing.   Eyes: Negative for pain and visual disturbance.  Respiratory: Negative for cough, shortness of breath and wheezing.   Cardiovascular: Negative for chest pain and palpitations.  Gastrointestinal: Negative for abdominal distention, abdominal pain, blood in stool, constipation, diarrhea, nausea, rectal pain and vomiting.  Genitourinary: Negative for dysuria, hematuria and testicular pain.  Musculoskeletal: Negative for arthralgias and joint swelling.  Skin: Negative for rash.  Neurological: Negative for dizziness, syncope and headaches.  Hematological: Negative for adenopathy.  Psychiatric/Behavioral: Negative for confusion and dysphoric mood.        Objective:   Physical Exam Constitutional:      General: He is not in acute distress.    Appearance: He is well-developed.  HENT:     Head: Normocephalic and atraumatic.     Right Ear: External ear normal.     Left Ear: External ear normal.  Eyes:     Conjunctiva/sclera: Conjunctivae normal.     Pupils: Pupils are equal, round, and reactive to light.  Neck:     Musculoskeletal: Normal range of motion and neck supple.     Thyroid: No thyromegaly.  Cardiovascular:     Rate and Rhythm: Normal rate and regular rhythm.     Heart sounds: Normal heart sounds. No murmur.  Pulmonary:     Effort: No respiratory distress.     Breath sounds: No wheezing or rales.  Abdominal:     General: Bowel sounds are normal. There is no distension.     Palpations: Abdomen is soft. There is no mass.     Tenderness: There is no abdominal tenderness. There is no guarding or rebound.  Lymphadenopathy:     Cervical: No cervical adenopathy.  Skin:    Findings: No rash.  Neurological:     Mental Status: He is alert and oriented to person, place, and time.     Cranial Nerves: No cranial nerve deficit.     Deep Tendon Reflexes: Reflexes normal.        Assessment:     Physical exam.  Patient has history of MS which has been very stable for several years and followed by neurology.  No other specific complaints  today    Plan:     -Recommend flu vaccine and patient declines -Obtain follow-up labs -We have encouraged that he engage in more consistent exercise and try to lose some weight -We recommend he consider further counseling regarding his marital discord  Kristian Covey MD [facility] at [facility]

## 2018-04-16 ENCOUNTER — Encounter: Payer: Self-pay | Admitting: Family Medicine

## 2018-04-16 ENCOUNTER — Ambulatory Visit: Payer: BLUE CROSS/BLUE SHIELD | Admitting: Family Medicine

## 2018-04-16 ENCOUNTER — Other Ambulatory Visit: Payer: Self-pay

## 2018-04-16 VITALS — BP 128/88 | HR 77 | Temp 98.2°F | Ht 72.0 in | Wt 297.3 lb

## 2018-04-16 DIAGNOSIS — M940 Chondrocostal junction syndrome [Tietze]: Secondary | ICD-10-CM | POA: Diagnosis not present

## 2018-04-16 NOTE — Progress Notes (Signed)
  Subjective:     Patient ID: Darren Knapp, male   DOB: 08/16/1980, 38 y.o.   MRN: [id number]  HPI Patient is seen with chest wall pain.  Onset this past Tuesday.  He denies specific injury but was doing little bit of pushing of a heavy object and wonders if that may have precipitated.  His pain was fairly intense first couple days but actually better today.  He has some soreness in the right costochondral area.  He took some ibuprofen over-the-counter and thinks that may have helped.  Denies any recent cough or fever.  No pleuritic pain.  No calf pain or any leg edema.  Past Medical History:  Diagnosis Date  . Arthritis   . Hypertension   . Multiple sclerosis (HCC) 2011  . Neuromuscular disorder (HCC)    History reviewed. No pertinent surgical history.  reports that he has quit smoking. He has never used smokeless tobacco. He reports current alcohol use of about 7.0 standard drinks of alcohol per week. He reports that he does not use drugs. family history includes Heart disease in his maternal grandfather; Hypertension in his father and mother. Allergies  Allergen Reactions  . Glatiramer Anxiety and Shortness Of Breath    Pressure behind his head  . Iodine-131 Nausea Only     Review of Systems  Constitutional: Negative for chills and fever.  Respiratory: Negative for cough and shortness of breath.   Cardiovascular: Positive for chest pain.  Neurological: Negative for dizziness.       Objective:   Physical Exam Constitutional:      Appearance: He is well-developed.  Neck:     Musculoskeletal: Neck supple.  Cardiovascular:     Rate and Rhythm: Normal rate and regular rhythm.     Heart sounds: Normal heart sounds. No murmur. No systolic murmur. No friction rub. No gallop.   Pulmonary:     Effort: Pulmonary effort is normal. No respiratory distress.     Breath sounds: Normal breath sounds.  Chest:     Comments: Mild costochondral tenderness around the right third rib  space Neurological:     Mental Status: He is alert.        Assessment:     Costochondritis.  Symptoms already improving with over-the-counter ibuprofen    Plan:     -Continue with ibuprofen.  May take up to 800 mg every 8 hours as needed with food. -Touch base if symptoms do not continue to improve into next week.    Kristian Covey MD  Primary Care at [facility]

## 2018-04-16 NOTE — Patient Instructions (Signed)
Costochondritis    Costochondritis is swelling and irritation (inflammation) of the tissue (cartilage) that connects your ribs to your breastbone (sternum). This causes pain in the front of your chest. The pain usually starts gradually and involves more than one rib.  What are the causes?  The exact cause of this condition is not always known. It results from stress on the cartilage where your ribs attach to your sternum. The cause of this stress could be:   Chest injury (trauma).   Exercise or activity, such as lifting.   Severe coughing.  What increases the risk?  You may be at higher risk for this condition if you:   Are male.   Are 30?38 years old.   Recently started a new exercise or work activity.   Have low levels of vitamin D.   Have a condition that makes you cough frequently.  What are the signs or symptoms?  The main symptom of this condition is chest pain. The pain:   Usually starts gradually and can be sharp or dull.   Gets worse with deep breathing, coughing, or exercise.   Gets better with rest.   May be worse when you press on the sternum-rib connection (tenderness).  How is this diagnosed?  This condition is diagnosed based on your symptoms, medical history, and a physical exam. Your health care provider will check for tenderness when pressing on your sternum. This is the most important finding. You may also have tests to rule out other causes of chest pain. These may include:   A chest X-ray to check for lung problems.   An electrocardiogram (ECG) to see if you have a heart problem that could be causing the pain.   An imaging scan to rule out a chest or rib fracture.  How is this treated?  This condition usually goes away on its own over time. Your health care provider may prescribe an NSAID to reduce pain and inflammation. Your health care provider may also suggest that you:   Rest and avoid activities that make pain worse.   Apply heat or cold to the area to reduce pain and  inflammation.   Do exercises to stretch your chest muscles.  If these treatments do not help, your health care provider may inject a numbing medicine at the sternum-rib connection to help relieve the pain.  Follow these instructions at home:   Avoid activities that make pain worse. This includes any activities that use chest, abdominal, and side muscles.   If directed, put ice on the painful area:  ? Put ice in a plastic bag.  ? Place a towel between your skin and the bag.  ? Leave the ice on for 20 minutes, 2-3 times a day.   If directed, apply heat to the affected area as often as told by your health care provider. Use the heat source that your health care provider recommends, such as a moist heat pack or a heating pad.  ? Place a towel between your skin and the heat source.  ? Leave the heat on for 20-30 minutes.  ? Remove the heat if your skin turns bright red. This is especially important if you are unable to feel pain, heat, or cold. You may have a greater risk of getting burned.   Take over-the-counter and prescription medicines only as told by your health care provider.   Return to your normal activities as told by your health care provider. Ask your health care provider what   activities are safe for you.   Keep all follow-up visits as told by your health care provider. This is important.  Contact a health care provider if:   You have chills or a fever.   Your pain does not go away or it gets worse.   You have a cough that does not go away (is persistent).  Get help right away if:   You have shortness of breath.  This information is not intended to replace advice given to you by your health care provider. Make sure you discuss any questions you have with your health care provider.  Document Released: 11/13/2004 Document Revised: 11/05/2016 Document Reviewed: 05/30/2015  Elsevier Interactive Patient Education  2019 [employer]

## 2018-10-24 ENCOUNTER — Ambulatory Visit (HOSPITAL_COMMUNITY)
Admission: EM | Admit: 2018-10-24 | Discharge: 2018-10-24 | Disposition: A | Discharge status: Home / Self Care | Payer: BLUE CROSS/BLUE SHIELD | Attending: Emergency Medicine | Admitting: Emergency Medicine

## 2018-10-24 ENCOUNTER — Other Ambulatory Visit: Payer: Self-pay

## 2018-10-24 ENCOUNTER — Encounter (HOSPITAL_COMMUNITY): Payer: Self-pay

## 2018-10-24 DIAGNOSIS — M25461 Effusion, right knee: Secondary | ICD-10-CM

## 2018-10-24 DIAGNOSIS — M25561 Pain in right knee: Secondary | ICD-10-CM

## 2018-10-24 MED ORDER — MELOXICAM 15 MG PO TABS: 15.0000 mg | ORAL_TABLET | Freq: Every day | ORAL | 0 refills | Status: AC

## 2018-10-24 NOTE — ED Provider Notes (Signed)
[facility]    CSN: [id number] Arrival date & time: 10/24/18  6754      History   Chief Complaint Chief Complaint  Patient presents with  . Knee Pain    Right    HPI Darren Knapp is a 38 y.o. male.   Darren Knapp presents with complaints of right knee pain and swelling he woke with two days ago. No specific injury although had been driving in a low car the day prior. Very stiff when he wakes up. Pain is worse when he first initiates activity, but improves with activity. Pain 8/10. Ibuprofen helped yesterday. Hasn't taken any today. Denies any previous similar, no previous knee injuries. No numbness or tingling. No redness warmth or fevers. Pain and swelling at lateral knee. No ankle pain. History  Of ms, arthritis, htn.     ROS per HPI, negative if not otherwise mentioned.      Past Medical History:  Diagnosis Date  . Arthritis   . Hypertension   . Multiple sclerosis (HCC) 2011  . Neuromuscular disorder [facility])     Patient Active Problem List   Diagnosis Date Noted  . Multiple sclerosis (HCC) 03/09/2015  . Alcohol abuse 03/09/2015  . ACUTE TRANSVERSE MYELITIS NOS 07/27/2008  . HYPERTENSION 07/27/2008    History reviewed. No pertinent surgical history.     Home Medications    Prior to Admission medications   Medication Sig Start Date End Date Taking? Authorizing Provider  Dimethyl Fumarate (TECFIDERA) 120 & 240 MG MISC  11/08/15   [provider]  glucosamine-chondroitin 500-400 MG tablet Take 1 tablet by mouth 3 (three) times daily.    [provider]  meloxicam (MOBIC) 15 MG tablet Take 1 tablet (15 mg total) by mouth daily for 20 days. 10/24/18 11/13/18  Georgetta Haber, NP  vitamin C (ASCORBIC ACID) 500 MG tablet Take 500 mg by mouth daily.    [provider]  VITAMIN E PO Take 1 capsule by mouth daily.    [provider]    Family History Family History  Problem Relation Age of Onset  . Hypertension Mother    . Hypertension Father   . Heart disease Maternal Grandfather     Social History Social History   Tobacco Use  . Smoking status: Former Games developer  . Smokeless tobacco: Never Used  Substance Use Topics  . Alcohol use: Yes    Alcohol/week: 17.0 standard drinks    Types: 17 Cans of beer per week  . Drug use: No     Allergies   Glatiramer and Iodine-131   Review of Systems Review of Systems   Physical Exam Triage Vital Signs ED Triage Vitals  Enc Vitals Group     BP 10/24/18 1011 (!) 140/93     Pulse Rate 10/24/18 1011 92     Resp 10/24/18 1011 18     Temp 10/24/18 1011 98.4 F (36.9 C)     Temp Source 10/24/18 1011 Oral     SpO2 10/24/18 1011 98 %     Weight --      Height --      Head Circumference --      Peak Flow --      Pain Score 10/24/18 1009 8     Pain Loc --      Pain Edu? --      Excl. in GC? --    No data found.  Updated Vital Signs BP (!) 140/93 (BP Location: Right  Arm)   Pulse 92   Temp 98.4 F (36.9 C) (Oral)   Resp 18   SpO2 98%    Physical Exam Constitutional:      Appearance: He is well-developed.  Cardiovascular:     Rate and Rhythm: Normal rate.  Pulmonary:     Effort: Pulmonary effort is normal.  Musculoskeletal:     Right knee: He exhibits effusion. He exhibits normal range of motion, no ecchymosis, no deformity, no laceration, no erythema, normal alignment, no LCL laxity, normal patellar mobility, no bony tenderness, normal meniscus and no MCL laxity. Tenderness found.     Comments: Tenderness at lateral right knee soft tissue, not at bony joint line or LCL insertion; noted effusion lateral to patella; no redness or warmth; some pain with extension; no laxity or pain with lateral stress; strength equal bilaterally; gross sensation intact to lower extremities   Skin:    General: Skin is warm and dry.  Neurological:     Mental Status: He is alert and oriented to person, place, and time.      UC Treatments / Results  Labs (all  labs ordered are listed, but only abnormal results are displayed) Labs Reviewed - No data to display  EKG   Radiology No results found.  Procedures Procedures (including critical care time)  Medications Ordered in UC Medications - No data to display  Initial Impression / Assessment and Plan / UC Course  I have reviewed the triage vital signs and the nursing notes.  Pertinent labs & imaging results that were available during my care of the patient were reviewed by me and considered in my medical decision making (see chart for details).     Effusion noted, history and physical sounds most consistent with arthritis with effusion. No injury. nsaids and ace wrap provided. Activity as tolerated. Follow up with PCP and/or ortho as needed. Patient verbalized understanding and agreeable to plan.  Ambulatory out of clinic without difficulty.    Final Clinical Impressions(s) / UC Diagnoses   Final diagnoses:  Acute pain of right knee  Effusion of right knee     Discharge Instructions     Ice and elevate the knee as able to help with swelling.  Light and regular activity as tolerated.  Ace wrap for compression.  Daily meloxicam, take with food and don't take additional ibuprofen.  If symptoms persist please follow up with your pcp for further evaluation and referral if needed.    ED Prescriptions    Medication Sig Dispense Auth. Provider   meloxicam (MOBIC) 15 MG tablet Take 1 tablet (15 mg total) by mouth daily for 20 days. 20 tablet Zigmund Gottron, NP     Controlled Substance Prescriptions [place] Controlled Substance Registry consulted? Not Applicable   Zigmund Gottron, NP 10/24/18 1031

## 2018-10-24 NOTE — Discharge Instructions (Signed)
Ice and elevate the knee as able to help with swelling.  Light and regular activity as tolerated.  Ace wrap for compression.  Daily meloxicam, take with food and don't take additional ibuprofen.  If symptoms persist please follow up with your pcp for further evaluation and referral if needed.

## 2018-10-24 NOTE — ED Triage Notes (Signed)
Patient presents to Urgent Care with complaints of right knee pain since two days ago. Patient reports he got out of a low car and twisted as he stood up and his knee has been hurting ever since, has been taking ibuprofen with some relief, pain is mostly lateral knee.

## 2018-11-01 ENCOUNTER — Ambulatory Visit: Payer: BLUE CROSS/BLUE SHIELD | Admitting: Family Medicine

## 2018-11-03 ENCOUNTER — Other Ambulatory Visit: Payer: Self-pay

## 2018-11-03 ENCOUNTER — Encounter: Payer: Self-pay | Admitting: Family Medicine

## 2018-11-03 ENCOUNTER — Ambulatory Visit: Payer: BLUE CROSS/BLUE SHIELD | Admitting: Family Medicine

## 2018-11-03 VITALS — BP 130/84 | HR 68 | Temp 98.1°F | Ht 72.0 in | Wt 293.2 lb

## 2018-11-03 DIAGNOSIS — Z113 Encounter for screening for infections with a predominantly sexual mode of transmission: Secondary | ICD-10-CM

## 2018-11-03 NOTE — Progress Notes (Signed)
  Subjective:     Patient ID: Darren Knapp, male   DOB: February 03, 1981, 38 y.o.   MRN: [id number]  HPI  Patient seen with request for STD screening.  He is not having any symptoms such as dysuria, fever, chills, rash.  He denies any history of STD.  He states his wife had requested testing.  He denies having any other new partners recently.  Generally feels well with no other symptoms at this time.  Past Medical History:  Diagnosis Date  . Arthritis   . Hypertension   . Multiple sclerosis ([place]) 2011  . Neuromuscular disorder ([place])    History reviewed. No pertinent surgical history.  reports that he has quit smoking. He has never used smokeless tobacco. He reports current alcohol use of about 17.0 standard drinks of alcohol per week. He reports that he does not use drugs. family history includes Heart disease in his maternal grandfather; Hypertension in his father and mother. Allergies  Allergen Reactions  . Glatiramer Anxiety and Shortness Of Breath    Pressure behind his head  . Iodine-131 Nausea Only    Review of Systems  Constitutional: Negative for chills and fever.  Genitourinary: Negative for dysuria.  Skin: Negative for rash.       Objective:   Physical Exam Constitutional:      Appearance: Normal appearance.  Cardiovascular:     Rate and Rhythm: Normal rate and regular rhythm.     Heart sounds: No murmur.  Pulmonary:     Effort: Pulmonary effort is normal.     Breath sounds: Normal breath sounds.  Neurological:     Mental Status: He is alert.        Assessment:     STD screening request per patient.  Patient asymptomatic    Plan:     -Check HIV, RPR, GC, chlamydia.  Will check hepatitis B surface antigen as he is not aware of prior hepatitis B vaccination -Discussed STD prevention  Eulas Post MD [facility]

## 2018-11-03 NOTE — Addendum Note (Signed)
Addended by: Elmer Picker on: 11/03/2018 09:12 AM   Modules accepted: Orders

## 2018-11-03 NOTE — Addendum Note (Signed)
Addended by: Elmer Picker on: 11/03/2018 09:13 AM   Modules accepted: Orders

## 2018-11-04 LAB — HEPATITIS B SURFACE ANTIGEN: Hepatitis B Surface Ag: NONREACTIVE

## 2018-11-04 LAB — C. TRACHOMATIS/N. GONORRHOEAE RNA
C. trachomatis RNA, TMA: NOT DETECTED
N. gonorrhoeae RNA, TMA: NOT DETECTED

## 2018-11-04 LAB — RPR: RPR Ser Ql: NONREACTIVE

## 2018-11-04 LAB — HIV ANTIBODY (ROUTINE TESTING W REFLEX): HIV 1&2 Ab, 4th Generation: NONREACTIVE

## 2018-11-12 ENCOUNTER — Telehealth: Payer: Self-pay

## 2018-11-12 NOTE — Telephone Encounter (Signed)
   Copied from [place] [phone]. Topic: General - Other >> Nov 11, 2018  2:50 PM Wynetta Emery, [place] C wrote: Reason for CRM: pt's spouse called in requesting a call back for pt's lab results.    CB: [phone] -

## 2018-11-22 NOTE — Telephone Encounter (Signed)
Spoke with patients spouse (in [place]), she is aware of the results.

## 2019-02-05 ENCOUNTER — Other Ambulatory Visit: Payer: Self-pay

## 2019-02-05 ENCOUNTER — Encounter (HOSPITAL_COMMUNITY): Payer: Self-pay

## 2019-02-05 ENCOUNTER — Ambulatory Visit (HOSPITAL_COMMUNITY)
Admission: EM | Admit: 2019-02-05 | Discharge: 2019-02-05 | Disposition: A | Discharge status: Home / Self Care | Payer: BC Managed Care – PPO | Attending: Emergency Medicine | Admitting: Emergency Medicine

## 2019-02-05 DIAGNOSIS — U071 COVID-19: Secondary | ICD-10-CM | POA: Insufficient documentation

## 2019-02-05 LAB — POC SARS CORONAVIRUS 2 AG -  ED: SARS Coronavirus 2 Ag: POSITIVE — AB

## 2019-02-05 LAB — POC SARS CORONAVIRUS 2 AG: SARS Coronavirus 2 Ag: POSITIVE — AB

## 2019-02-05 LAB — POCT RAPID STREP A: Streptococcus, Group A Screen (Direct): NEGATIVE

## 2019-02-05 MED ORDER — IBUPROFEN 600 MG PO TABS: 600.0000 mg | ORAL_TABLET | Freq: Four times a day (QID) | ORAL | 0 refills | Status: DC | PRN

## 2019-02-05 MED ORDER — ACETAMINOPHEN 325 MG PO TABS
ORAL_TABLET | ORAL | Status: AC
  Filled 2019-02-05: qty 2

## 2019-02-05 MED ORDER — ACETAMINOPHEN 325 MG PO TABS
650.0000 mg | ORAL_TABLET | Freq: Once | ORAL | Status: AC
  Administered 2019-02-05: 19:00:00 650 mg via ORAL

## 2019-02-05 MED ORDER — BENZONATATE 200 MG PO CAPS: 200.0000 mg | ORAL_CAPSULE | Freq: Three times a day (TID) | ORAL | 0 refills | Status: DC | PRN

## 2019-02-05 MED ORDER — FLUTICASONE PROPIONATE 50 MCG/ACT NA SUSP: 2.0000 | Freq: Every day | NASAL | 0 refills | Status: DC

## 2019-02-05 NOTE — ED Provider Notes (Signed)
HPI  SUBJECTIVE:  Darren Knapp is a 38 y.o. male who presents with fatigue, lightheadedness, fevers to 38 C starting at home today.  He reports mild headaches, cough, and itchy throat.  He denies body aches, nasal congestion, sore throat, shortness of breath, sinus pain or pressure.  No postnasal drip.  No nausea, vomiting, diarrhea, abdominal pain, loss of sense of smell or taste.  No ear pain, urinary complaints, rash.  No known Covid exposure, but he works for [employer].  He tried ibuprofen 400 mg with improvement in his symptoms.  No aggravating factors.  No antipyretic in the past 4 to 6 hours.  He has a past medical history of hypertension, MS, alcohol abuse.  Denies history of cirrhosis.  [id number], Alinda Sierras, MD   Past Medical History:  Diagnosis Date  . Arthritis   . Hypertension   . Multiple sclerosis ([place]) 2011  . Neuromuscular disorder ([place])     History reviewed. No pertinent surgical history.  Family History  Problem Relation Age of Onset  . Hypertension Mother   . Hypertension Father   . Heart disease Maternal Grandfather     Social History   Tobacco Use  . Smoking status: Former Research scientist (life sciences)  . Smokeless tobacco: Never Used  Substance Use Topics  . Alcohol use: Yes    Alcohol/week: 17.0 standard drinks    Types: 17 Cans of beer per week  . Drug use: No    No current facility-administered medications for this encounter.  Current Outpatient Medications:  .  benzonatate (TESSALON) 200 MG capsule, Take 1 capsule (200 mg total) by mouth 3 (three) times daily as needed for cough., Disp: 30 capsule, Rfl: 0 .  Dimethyl Fumarate (TECFIDERA) 120 & 240 MG MISC, , Disp: , Rfl:  .  fluticasone (FLONASE) 50 MCG/ACT nasal spray, Place 2 sprays into both nostrils daily., Disp: 16 g, Rfl: 0 .  glucosamine-chondroitin 500-400 MG tablet, Take 1 tablet by mouth 3 (three) times daily., Disp: , Rfl:  .  ibuprofen (ADVIL) 600 MG tablet, Take 1 tablet (600 mg total) by mouth every 6 (six)  hours as needed., Disp: 30 tablet, Rfl: 0 .  vitamin C (ASCORBIC ACID) 500 MG tablet, Take 500 mg by mouth daily., Disp: , Rfl:  .  VITAMIN E PO, Take 1 capsule by mouth daily., Disp: , Rfl:   Allergies  Allergen Reactions  . Glatiramer Anxiety and Shortness Of Breath    Pressure behind his head  . Iodine-131 Nausea Only     ROS  As noted in HPI.   Physical Exam  BP (!) 155/100 (BP Location: Left Arm)   Pulse 100   Temp (!) 102 F (38.9 C) (Oral)   Resp 16   SpO2 100%   Constitutional: Well developed, well nourished, no acute distress Eyes: PERRL, EOMI, conjunctiva normal bilaterally HENT: Normocephalic, atraumatic,mucus membranes moist.  Mucoid nasal congestion, erythematous, but not swollen turbinates.  No maxillary or frontal sinus tenderness.  Erythematous oropharynx, tonsils erythematous, slightly enlarged, without exudates.  Uvula midline.  Unable to visualize posterior oropharynx. Neck: No anterior, posterior cervical lymph nodes. Respiratory: Clear to auscultation bilaterally, no rales, no wheezing, no rhonchi Cardiovascular: Normal rate and rhythm, no murmurs, no gallops, no rubs GI: Soft, nondistended, normal bowel sounds, nontender, no rebound, no guarding.  No splenomegaly skin: No rash, skin intact Musculoskeletal: No edema, no tenderness, no deformities Neurologic: Alert & oriented x 3, CN III-XII grossly intact, no motor deficits, sensation grossly intact Psychiatric: Speech  and behavior appropriate   ED Course   Medications  acetaminophen (TYLENOL) tablet 650 mg (650 mg Oral Given 02/05/19 1840)    Orders Placed This Encounter  Procedures  . Culture, group A strep (throat)    Standing Status:   Standing    Number of Occurrences:   1  . POC SARS Coronavirus 2 Ag-ED - Nasal Swab (BD Veritor Kit)    Standing Status:   Standing    Number of Occurrences:   1    Order Specific Question:   Is this test for diagnosis or screening    Answer:   Diagnosis of  ill patient    Order Specific Question:   Symptomatic for COVID-19 as defined by CDC    Answer:   Yes    Order Specific Question:   Date of Symptom Onset    Answer:   02/05/2019    Order Specific Question:   Hospitalized for COVID-19    Answer:   Yes    Order Specific Question:   Admitted to ICU for COVID-19    Answer:   No    Order Specific Question:   Previously tested for COVID-19    Answer:   Yes    Order Specific Question:   Resident in a congregate (group) care setting    Answer:   No    Order Specific Question:   Employed in healthcare setting    Answer:   No  . POC SARS Coronavirus 2 Ag    Standing Status:   Standing    Number of Occurrences:   1  . POCT rapid strep [facility])    Standing Status:   Standing    Number of Occurrences:   1   No results found for this or any previous visit (from the past 24 hour(s)). No results found.  ED Clinical Impression  1. COVID-19 virus infection      ED Assessment/Plan  Rapid Covid positive.  Home with supportive treatment.  Tessalon, ibuprofen 610 mg combined with 1 g of Tylenol 3-4 times a day as needed, Flonase, saline nasal irrigation, Covid work note.   Discussed labs, MDM, treatment plan, and plan for follow-up with patient Discussed sn/sx that should prompt return to the ED. patient agrees with plan.   Meds ordered this encounter  Medications  . acetaminophen (TYLENOL) tablet 650 mg  . fluticasone (FLONASE) 50 MCG/ACT nasal spray    Sig: Place 2 sprays into both nostrils daily.    Dispense:  16 g    Refill:  0  . benzonatate (TESSALON) 200 MG capsule    Sig: Take 1 capsule (200 mg total) by mouth 3 (three) times daily as needed for cough.    Dispense:  30 capsule    Refill:  0  . ibuprofen (ADVIL) 600 MG tablet    Sig: Take 1 tablet (600 mg total) by mouth every 6 (six) hours as needed.    Dispense:  30 tablet    Refill:  0    *This clinic note was created using [employer]. Therefore,  there may be occasional mistakes despite careful proofreading.  ?   Melynda Ripple, MD 02/07/19 [phone]

## 2019-02-05 NOTE — Discharge Instructions (Addendum)
Take the 600 mg of ibuprofen combined with 1 g of Tylenol 3-4 times a day as needed for headache, fever.  Tessalon as needed for cough, Flonase saline nasal irrigation with a Milta Deiters med rinse and distilled water as often as you want for nasal congestion.  He can also try taking vitamin C 500 mg twice a day, vitamin D3 at 1000 units once a day, zinc 50 to 75 mg once a day to help boost your immune system.  Listerine to help prevent transmission.  Some studies have shown that this regimen helps people with Covid.

## 2019-02-05 NOTE — ED Triage Notes (Signed)
Pt present fever and throat pain. Symptoms started today. Pt denies any other symptoms

## 2019-02-08 ENCOUNTER — Telehealth: Payer: Self-pay | Admitting: Unknown Physician Specialty

## 2019-02-08 ENCOUNTER — Other Ambulatory Visit: Payer: Self-pay | Admitting: Unknown Physician Specialty

## 2019-02-08 DIAGNOSIS — U071 COVID-19: Secondary | ICD-10-CM

## 2019-02-08 LAB — CULTURE, GROUP A STREP (THRC)

## 2019-02-08 NOTE — Telephone Encounter (Signed)
  I connected by phone with Darren Knapp on 02/08/2019 at 4:35 PM to discuss the potential use of an new treatment for mild to moderate COVID-19 viral infection in non-hospitalized patients.  This patient is a 38 y.o. male that meets the FDA criteria for Emergency Use Authorization of bamlanivimab or casirivimab\imdevimab.  Has a (+) direct SARS-CoV-2 viral test result  Has mild or moderate COVID-19   Is ? 38 years of age and weighs ? 40 kg  Is NOT hospitalized due to COVID-19  Is NOT requiring oxygen therapy or requiring an increase in baseline oxygen flow rate due to COVID-19  Is within 10 days of symptom onset  Has at least one of the high risk factor(s) for progression to severe COVID-19 and/or hospitalization as defined in EUA.  Specific high risk criteria : BMI >/= 35   I have spoken and communicated the following to the patient or parent/caregiver:  1. FDA has authorized the emergency use of bamlanivimab and casirivimab\imdevimab for the treatment of mild to moderate COVID-19 in adults and pediatric patients with positive results of direct SARS-CoV-2 viral testing who are 29 years of age and older weighing at least 40 kg, and who are at high risk for progressing to severe COVID-19 and/or hospitalization.  2. The significant known and potential risks and benefits of bamlanivimab and casirivimab\imdevimab, and the extent to which such potential risks and benefits are unknown.  3. Information on available alternative treatments and the risks and benefits of those alternatives, including clinical trials.  4. Patients treated with bamlanivimab and casirivimab\imdevimab should continue to self-isolate and use infection control measures (e.g., wear mask, isolate, social distance, avoid sharing personal items, clean and disinfect "high touch" surfaces, and frequent handwashing) according to CDC guidelines.   5. The patient or parent/caregiver has the option to accept or refuse  bamlanivimab or casirivimab\imdevimab .  After reviewing this information with the patient, The patient agreed to proceed with receiving the bamlanimivab infusion and will be provided a copy of the Fact sheet prior to receiving the infusion.[name] 02/08/2019 4:35 PM

## 2019-02-10 ENCOUNTER — Ambulatory Visit (HOSPITAL_COMMUNITY): Payer: BC Managed Care – PPO | Attending: Pulmonary Disease

## 2019-02-19 ENCOUNTER — Ambulatory Visit (HOSPITAL_COMMUNITY)
Admission: EM | Admit: 2019-02-19 | Discharge: 2019-02-19 | Disposition: A | Discharge status: Home / Self Care | Payer: BC Managed Care – PPO | Attending: Urgent Care | Admitting: Urgent Care

## 2019-02-19 ENCOUNTER — Encounter (HOSPITAL_COMMUNITY): Payer: Self-pay

## 2019-02-19 ENCOUNTER — Other Ambulatory Visit: Payer: Self-pay

## 2019-02-19 DIAGNOSIS — U071 COVID-19: Secondary | ICD-10-CM

## 2019-02-19 DIAGNOSIS — R05 Cough: Secondary | ICD-10-CM | POA: Diagnosis not present

## 2019-02-19 DIAGNOSIS — J9801 Acute bronchospasm: Secondary | ICD-10-CM

## 2019-02-19 DIAGNOSIS — R058 Other specified cough: Secondary | ICD-10-CM

## 2019-02-19 MED ORDER — PROMETHAZINE-DM 6.25-15 MG/5ML PO SYRP: 5.0000 mL | ORAL_SOLUTION | Freq: Every evening | ORAL | 0 refills | Status: DC | PRN

## 2019-02-19 MED ORDER — ALBUTEROL SULFATE HFA 108 (90 BASE) MCG/ACT IN AERS: 1.0000 | INHALATION_SPRAY | Freq: Four times a day (QID) | RESPIRATORY_TRACT | 0 refills | Status: DC | PRN

## 2019-02-19 MED ORDER — BENZONATATE 200 MG PO CAPS: 200.0000 mg | ORAL_CAPSULE | Freq: Three times a day (TID) | ORAL | 0 refills | Status: DC | PRN

## 2019-02-19 NOTE — Discharge Instructions (Signed)
We will manage this as persistent symptoms from her COVID-19 infection.  If you develop chest pain, shortness of breath, fevers then please report to the emergency room for a recheck. For sore throat or cough try using a honey-based tea. Use 3 teaspoons of honey with juice squeezed from half lemon. Place shaved pieces of ginger into 1/2-1 cup of water and warm over stove top. Then mix the ingredients and repeat every 4 hours as needed. Please take Tylenol 500mg  every 6 hours. Hydrate very well with at least 2 liters of water. Eat light meals such as soups to replenish electrolytes and soft fruits, veggies. Start an antihistamine like Zyrtec (cetirizine) at 10mg  daily for postnasal drainage, sinus congestion.  You can take this together with pseudoephedrine (Sudafed) at a dose of 30 mg 3 times a day or twice daily as needed for the same kind of congestion.  Make sure you ask for pseudoephedrine specifically as they require you present a license to obtain it.

## 2019-02-19 NOTE — ED Triage Notes (Signed)
Pt present coughing that started after he got over covid 19. Pt  Concern because when he cough up mucous there is blood  With the mucous.

## 2019-02-19 NOTE — ED Provider Notes (Signed)
[facility]   MRN: [id number] DOB: 06-16-1980  Subjective:   Darren Knapp is a 39 y.o. male presenting for 2-week history of persistent productive cough, had some streaks and specks of what he thinks is blood in the past few days.  He has been using benzonatate capsules and feels like he is recovered well except for what he thinks is blood in the mucus.  He also continually gets scratch like the urge to cough when he takes a deep breath.  Denies fevers, chest pain, shortness of breath, sinus congestion, sinus pain, throat pain.  Denies history of asthma, COPD.  Patient is not a smoker.  No current facility-administered medications for this encounter.  Current Outpatient Medications:  .  benzonatate (TESSALON) 200 MG capsule, Take 1 capsule (200 mg total) by mouth 3 (three) times daily as needed for cough., Disp: 30 capsule, Rfl: 0 .  Dimethyl Fumarate (TECFIDERA) 120 & 240 MG MISC, , Disp: , Rfl:  .  fluticasone (FLONASE) 50 MCG/ACT nasal spray, Place 2 sprays into both nostrils daily., Disp: 16 g, Rfl: 0 .  glucosamine-chondroitin 500-400 MG tablet, Take 1 tablet by mouth 3 (three) times daily., Disp: , Rfl:  .  ibuprofen (ADVIL) 600 MG tablet, Take 1 tablet (600 mg total) by mouth every 6 (six) hours as needed., Disp: 30 tablet, Rfl: 0 .  vitamin C (ASCORBIC ACID) 500 MG tablet, Take 500 mg by mouth daily., Disp: , Rfl:  .  VITAMIN E PO, Take 1 capsule by mouth daily., Disp: , Rfl:    Allergies  Allergen Reactions  . Glatiramer Anxiety and Shortness Of Breath    Pressure behind his head  . Iodine-131 Nausea Only    Past Medical History:  Diagnosis Date  . Arthritis   . Hypertension   . Multiple sclerosis (HCC) 2011  . Neuromuscular disorder (HCC)      History reviewed. No pertinent surgical history.  Family History  Problem Relation Age of Onset  . Hypertension Mother   . Hypertension Father   . Heart disease Maternal Grandfather     Social History   Tobacco  Use  . Smoking status: Former Games developer  . Smokeless tobacco: Never Used  Substance Use Topics  . Alcohol use: Yes    Alcohol/week: 17.0 standard drinks    Types: 17 Cans of beer per week  . Drug use: No    ROS   Objective:   Vitals: BP (!) 141/78 (BP Location: Left Arm)   Pulse 91   Temp 98.9 F (37.2 C) (Oral)   Resp 18   SpO2 99%   Physical Exam Constitutional:      General: He is not in acute distress.    Appearance: Normal appearance. He is well-developed. He is obese. He is not ill-appearing, toxic-appearing or diaphoretic.  HENT:     Head: Normocephalic and atraumatic.     Right Ear: External ear normal.     Left Ear: External ear normal.     Nose: Nose normal.     Mouth/Throat:     Mouth: Mucous membranes are moist.     Pharynx: Oropharynx is clear.  Eyes:     General: No scleral icterus.    Extraocular Movements: Extraocular movements intact.     Pupils: Pupils are equal, round, and reactive to light.  Cardiovascular:     Rate and Rhythm: Normal rate and regular rhythm.     Heart sounds: Normal heart sounds. No murmur. No friction rub. No  gallop.   Pulmonary:     Effort: Pulmonary effort is normal. No respiratory distress.     Breath sounds: Normal breath sounds. No stridor. No wheezing, rhonchi or rales.  Neurological:     Mental Status: He is alert and oriented to person, place, and time.  Psychiatric:        Mood and Affect: Mood normal.        Behavior: Behavior normal.        Thought Content: Thought content normal.      Assessment and Plan :   1. COVID-19   2. Bronchospasm   3. Productive cough     Patient was agreeable to trying albuterol inhaler to address bronchospasm, will add promethazine cough syrup as well.  Refilled his [employer]  Otherwise continue supportive care for COVID-19 infection.  At this time I have low suspicion for patient having chest PE, discussed signs and symptoms of this.  If patient symptoms persist he will  return to clinic or the ER if he is significantly worse to obtain chest CT and emergent evaluation for [id number] complications. Counseled patient on potential for adverse effects with medications prescribed/recommended today, ER and return-to-clinic precautions discussed, patient verbalized understanding.    Jaynee Eagles, PA-C 02/19/19 1807

## 2019-03-27 ENCOUNTER — Ambulatory Visit (INDEPENDENT_AMBULATORY_CARE_PROVIDER_SITE_OTHER): Payer: BC Managed Care – PPO

## 2019-03-27 ENCOUNTER — Other Ambulatory Visit: Payer: Self-pay

## 2019-03-27 ENCOUNTER — Encounter (HOSPITAL_COMMUNITY): Payer: Self-pay

## 2019-03-27 ENCOUNTER — Ambulatory Visit (HOSPITAL_COMMUNITY)
Admission: EM | Admit: 2019-03-27 | Discharge: 2019-03-27 | Disposition: A | Discharge status: Home / Self Care | Payer: BC Managed Care – PPO | Attending: Emergency Medicine | Admitting: Emergency Medicine

## 2019-03-27 DIAGNOSIS — R079 Chest pain, unspecified: Secondary | ICD-10-CM | POA: Diagnosis not present

## 2019-03-27 MED ORDER — NAPROXEN 500 MG PO TABS: 500.0000 mg | ORAL_TABLET | Freq: Two times a day (BID) | ORAL | 0 refills | Status: DC

## 2019-03-27 NOTE — Discharge Instructions (Signed)
Your ekg and chest xray, as well as exam, are overall reassuring today.  I would like you to try naproxen, twice a day with food, for the next few days to see if pain is better managed. Don't take additional ibuprofen.  Please follow up with your primary care provider for recheck in the next few weeks as may need further evaluation and possibly referral.  Try to limit your smoking as this can aggravate chest symptoms and increase your risk of cardiovascular disease.  If any worsening of symptoms please go to the ER- worse or constant chest pain , pain that radiates to arm or jaw, sweating, dizziness, nausea or vomiting associated with chest pain, or otherwise worsening.

## 2019-03-27 NOTE — ED Provider Notes (Signed)
Darren Knapp    CSN: [id number] Arrival date & time: 03/27/19  1142      History   Chief Complaint Chief Complaint  Patient presents with  . Chest Pain    HPI Coal Nearhood is a 39 y.o. male.   Darren Knapp presents with complaints of chest pain. First noted it two days ago when he woke and got out of bed. Feels like the motion of pushing out of bed was painful. He then has noticed the pain since, worse with certain movements such as getting in and out of his truck, pushing out of a chair, etc. He works for [employer]. This morning when he woke the pain was worse which brought him in today. No shortness of breath , no cough, no arm or jaw pain, no episodes of diaphoresis. Walking or going up stairs doesn't trigger it. He is right handed.  Pain is central and somewhat to left of sternum. This morning if he was to press on the area it would be painful. He took 600mg  of ibuprofen which has seemed to help as now with minimal to no pain. He had covid last month, no longer with any symptoms of this and has been feeling better. No personal cardiac history. Grandfather has had multiple bypass surgeries. No personal history of blood clots. He smokes marijuana a few times a week, approximately. Smoked last yesterday. No leg pain or swelling. History of MS.     ROS per HPI, negative if not otherwise mentioned.      Past Medical History:  Diagnosis Date  . Arthritis   . Hypertension   . Multiple sclerosis ([place]) 2011  . Neuromuscular disorder [facility])     Patient Active Problem List   Diagnosis Date Noted  . Multiple sclerosis ([place]) 03/09/2015  . Alcohol abuse 03/09/2015  . ACUTE TRANSVERSE MYELITIS NOS 07/27/2008  . HYPERTENSION 07/27/2008    History reviewed. No pertinent surgical history.     Home Medications    Prior to Admission medications   Medication Sig Start Date End Date Taking? Authorizing Provider  glucosamine-chondroitin 500-400 MG tablet Take 1 tablet by mouth 3  (three) times daily.   Yes [provider]  ibuprofen (ADVIL) 600 MG tablet Take 1 tablet (600 mg total) by mouth every 6 (six) hours as needed. 02/05/19  Yes Melynda Ripple, MD  albuterol (VENTOLIN HFA) 108 (90 Base) MCG/ACT inhaler Inhale 1-2 puffs into the lungs every 6 (six) hours as needed for wheezing or shortness of breath. 02/19/19   Jaynee Eagles, PA-C  benzonatate (TESSALON) 200 MG capsule Take 1 capsule (200 mg total) by mouth 3 (three) times daily as needed for cough. 02/19/19   Jaynee Eagles, PA-C  Dimethyl Fumarate (TECFIDERA) 120 & 240 MG MISC  11/08/15   [provider]  fluticasone (FLONASE) 50 MCG/ACT nasal spray Place 2 sprays into both nostrils daily. 02/05/19   Melynda Ripple, MD  naproxen (NAPROSYN) 500 MG tablet Take 1 tablet (500 mg total) by mouth 2 (two) times daily. 03/27/19   Zigmund Gottron, NP  promethazine-dextromethorphan (PROMETHAZINE-DM) 6.25-15 MG/5ML syrup Take 5 mLs by mouth at bedtime as needed for cough. 02/19/19   Jaynee Eagles, PA-C  vitamin C (ASCORBIC ACID) 500 MG tablet Take 500 mg by mouth daily.    [provider]  VITAMIN E PO Take 1 capsule by mouth daily.    [provider]    Family History Family History  Problem Relation Age of Onset  .  Hypertension Mother   . Hypertension Father   . Heart disease Maternal Grandfather     Social History Social History   Tobacco Use  . Smoking status: Former Games developer  . Smokeless tobacco: Never Used  Substance Use Topics  . Alcohol use: Yes    Alcohol/week: 17.0 standard drinks    Types: 17 Cans of beer per week  . Drug use: No     Allergies   Glatiramer, Iodine-131, and Ivp dye [iodinated diagnostic agents]   Review of Systems Review of Systems   Physical Exam Triage Vital Signs ED Triage Vitals  Enc Vitals Group     BP 03/27/19 1221 (!) 146/95     Pulse Rate 03/27/19 1221 80     Resp 03/27/19 1221 20     Temp 03/27/19 1221 98.6 F (37 C)     Temp Source  03/27/19 1221 Oral     SpO2 03/27/19 1221 100 %     Weight --      Height --      Head Circumference --      Peak Flow --      Pain Score 03/27/19 1216 7     Pain Loc --      Pain Edu? --      Excl. in GC? --    No data found.  Updated Vital Signs BP (!) 146/95 (BP Location: Left Arm)   Pulse 80   Temp 98.6 F (37 C) (Oral)   Resp 20   SpO2 100%   Visual Acuity Right Eye Distance:   Left Eye Distance:   Bilateral Distance:    Right Eye Near:   Left Eye Near:    Bilateral Near:     Physical Exam Constitutional:      Appearance: He is well-developed.  Cardiovascular:     Rate and Rhythm: Normal rate and regular rhythm.     Heart sounds: Normal heart sounds.  Pulmonary:     Effort: Pulmonary effort is normal. No tachypnea or accessory muscle usage.  Chest:     Chest wall: No tenderness.       Comments: Patient without pain currently, no chest wall pain with palpation currently (patient states earlier today it was reproducible with palpation); no pain with engagement of chest wall musculature with stretching, sitting in chair or movement of left arm; indicates pain to sternum and left of sternum when he feels it  Skin:    General: Skin is warm and dry.  Neurological:     Mental Status: He is alert and oriented to person, place, and time.    EKG:  NSR rate of 93 . Previous EKG was available for review. No stwave changes as interpreted by me.    UC Treatments / Results  Labs (all labs ordered are listed, but only abnormal results are displayed) Labs Reviewed - No data to display  EKG   Radiology DG Chest 2 View  Result Date: 03/27/2019 CLINICAL DATA:  Chest pain. EXAM: CHEST - 2 VIEW COMPARISON:  Jun 26, 2008. FINDINGS: The heart size and mediastinal contours are within normal limits. Both lungs are clear. No pneumothorax or pleural effusion is noted. The visualized skeletal structures are unremarkable. IMPRESSION: No active cardiopulmonary disease.  Electronically Signed   By: Lupita Raider M.D.   On: 03/27/2019 12:59    Procedures Procedures (including critical care time)  Medications Ordered in UC Medications - No data to display  Initial Impression / Assessment and Plan /  UC Course  I have reviewed the triage vital signs and the nursing notes.  Pertinent labs & imaging results that were available during my care of the patient were reviewed by me and considered in my medical decision making (see chart for details).     No acute chest pain currently. ekg and chest xray normal today. Chest pain resolved after taking ibuprofen this morning. Triggered with chest wall engagement/ activation. Suspect musculoskeletal source of symptoms. Encouraged close follow up with pcp for recheck and possible cardiology referral as indicated. Return precautions provided. Patient verbalized understanding and agreeable to plan.  Ambulatory out of clinic without difficulty.    Final Clinical Impressions(s) / UC Diagnoses   Final diagnoses:  Nonspecific chest pain     Discharge Instructions     Your ekg and chest xray, as well as exam, are overall reassuring today.  I would like you to try naproxen, twice a day with food, for the next few days to see if pain is better managed. Don't take additional ibuprofen.  Please follow up with your primary care provider for recheck in the next few weeks as may need further evaluation and possibly referral.  Try to limit your smoking as this can aggravate chest symptoms and increase your risk of cardiovascular disease.  If any worsening of symptoms please go to the ER- worse or constant chest pain , pain that radiates to arm or jaw, sweating, dizziness, nausea or vomiting associated with chest pain, or otherwise worsening.     ED Prescriptions    Medication Sig Dispense Auth. Provider   naproxen (NAPROSYN) 500 MG tablet Take 1 tablet (500 mg total) by mouth 2 (two) times daily. 30 tablet Georgetta Haber,  NP     PDMP not reviewed this encounter.   Georgetta Haber, NP 03/27/19 1338

## 2019-03-27 NOTE — ED Triage Notes (Signed)
Pt c/o central chest discomfort that increases with movement and deep breath since Friday upon awakening. Denies n/v, diaphoresis,dizziness or SOB, non-radiating. Able to socially drink outside last night. Took ibuprofen at approx 0900  Works as a Loss adjuster, chartered, but denies any recall of injury and has had similar sx intermittently throughout past several years.

## 2020-02-23 ENCOUNTER — Encounter (HOSPITAL_COMMUNITY): Payer: Self-pay

## 2020-02-23 ENCOUNTER — Other Ambulatory Visit: Payer: Self-pay

## 2020-02-23 ENCOUNTER — Ambulatory Visit (HOSPITAL_COMMUNITY)
Admission: EM | Admit: 2020-02-23 | Discharge: 2020-02-23 | Disposition: A | Discharge status: Home / Self Care | Payer: BC Managed Care – PPO

## 2020-02-23 DIAGNOSIS — H6690 Otitis media, unspecified, unspecified ear: Secondary | ICD-10-CM | POA: Diagnosis not present

## 2020-02-23 HISTORY — DX: Obesity, unspecified: E66.9

## 2020-02-23 MED ORDER — AMOXICILLIN 500 MG PO TABS: 500.0000 mg | ORAL_TABLET | Freq: Two times a day (BID) | ORAL | 0 refills | Status: AC

## 2020-02-23 NOTE — Discharge Instructions (Signed)
Take antibiotic as prescribed.  Return for any worsening pain or drainage.  Please schedule follow-up with Dr. Caryl Never regarding your blood pressure.  Avoid added salt in your diet

## 2020-02-23 NOTE — ED Provider Notes (Signed)
[facility]    CSN: [id number] Arrival date & time: 02/23/20  1150      History   Chief Complaint Chief Complaint  Patient presents with  . Otalgia    HPI Darren Knapp is a 40 y.o. male with past medical history of hypertension and MS presents to urgent care today with complaints of left ear pain.  Patient reports sudden onset of pain yesterday temporarily relieved with Motrin.  He denies any recent fever or chills though does endorse some URI symptoms last week now resolving with only mild congestion.   Past Medical History:  Diagnosis Date  . Arthritis   . Hypertension   . Multiple sclerosis ([facility]) 2011  . Neuromuscular disorder ([facility])   . Obesity     Patient Active Problem List   Diagnosis Date Noted  . Multiple sclerosis ([facility]) 03/09/2015  . Alcohol abuse 03/09/2015  . ACUTE TRANSVERSE MYELITIS NOS 07/27/2008  . HYPERTENSION 07/27/2008    History reviewed. No pertinent surgical history.     Home Medications    Prior to Admission medications   Medication Sig Start Date End Date Taking? Authorizing Provider  amoxicillin (AMOXIL) 500 MG tablet Take 1 tablet (500 mg total) by mouth 2 (two) times daily for 7 days. 02/23/20 03/01/20 Yes Rolla Etienne, NP  sodium chloride 0.45 % solution Inject into the vein. 07/12/19  Yes [provider]  albuterol (VENTOLIN HFA) 108 (90 Base) MCG/ACT inhaler Inhale 1-2 puffs into the lungs every 6 (six) hours as needed for wheezing or shortness of breath. 02/19/19   Wallis Bamberg, PA-C  benzonatate (TESSALON) 200 MG capsule Take 1 capsule (200 mg total) by mouth 3 (three) times daily as needed for cough. 02/19/19   Wallis Bamberg, PA-C  Dimethyl Fumarate (TECFIDERA) 120 & 240 MG MISC  11/08/15   [provider]  fluticasone (FLONASE) 50 MCG/ACT nasal spray Place 2 sprays into both nostrils daily. 02/05/19   Domenick Gong, MD  glucosamine-chondroitin 500-400 MG tablet Take 1 tablet by mouth 3 (three) times daily.     [provider]  ibuprofen (ADVIL) 600 MG tablet Take 1 tablet (600 mg total) by mouth every 6 (six) hours as needed. 02/05/19   Domenick Gong, MD  naproxen (NAPROSYN) 500 MG tablet Take 1 tablet (500 mg total) by mouth 2 (two) times daily. 03/27/19   Georgetta Haber, NP  OCREVUS 300 MG/10ML injection Inject into the vein. 12/30/19   [provider]  promethazine-dextromethorphan (PROMETHAZINE-DM) 6.25-15 MG/5ML syrup Take 5 mLs by mouth at bedtime as needed for cough. 02/19/19   Wallis Bamberg, PA-C  vitamin C (ASCORBIC ACID) 500 MG tablet Take 500 mg by mouth daily.    [provider]  VITAMIN E PO Take 1 capsule by mouth daily.    [provider]    Family History Family History  Problem Relation Age of Onset  . Hypertension Mother   . Hypertension Father   . Heart disease Maternal Grandfather     Social History Social History   Tobacco Use  . Smoking status: Former Games developer  . Smokeless tobacco: Never Used  Vaping Use  . Vaping Use: Former  Substance Use Topics  . Alcohol use: Yes    Alcohol/week: 17.0 standard drinks    Types: 17 Cans of beer per week  . Drug use: No     Allergies   Glatiramer, Iodine-131, and Ivp dye [iodinated diagnostic agents]   Review of Systems As stated in HPI  otherwise negative   Physical Exam Triage Vital Signs ED Triage Vitals  Enc Vitals Group     BP 02/23/20 1257 (!) 152/97     Pulse Rate 02/23/20 1257 72     Resp 02/23/20 1257 18     Temp 02/23/20 1257 98.4 F (36.9 C)     Temp Source 02/23/20 1257 Oral     SpO2 02/23/20 1257 99 %     Weight --      Height --      Head Circumference --      Peak Flow --      Pain Score 02/23/20 1254 0     Pain Loc --      Pain Edu? --      Excl. in GC? --    No data found.  Updated Vital Signs BP (!) 152/97 (BP Location: Right Arm)   Pulse 72   Temp 98.4 F (36.9 C) (Oral)   Resp 18   SpO2 99%   Visual Acuity Right Eye Distance:   Left Eye  Distance:   Bilateral Distance:    Right Eye Near:   Left Eye Near:    Bilateral Near:     Physical Exam Constitutional:      General: He is not in acute distress.    Appearance: Normal appearance. He is not ill-appearing or toxic-appearing.  HENT:     Head: Normocephalic and atraumatic.     Left Ear: Ear canal normal.     Ears:     Comments: Right TM retracted with air-fluid levels.  Left TM with significant erythema.  No drainage or perforation noted    Nose: No rhinorrhea.     Mouth/Throat:     Mouth: Mucous membranes are moist.     Pharynx: No oropharyngeal exudate or posterior oropharyngeal erythema.  Musculoskeletal:     Cervical back: Neck supple.  Lymphadenopathy:     Cervical: No cervical adenopathy.  Skin:    General: Skin is warm and dry.  Neurological:     Mental Status: He is alert and oriented to person, place, and time.  Psychiatric:        Mood and Affect: Mood normal.        Behavior: Behavior normal.      UC Treatments / Results  Labs (all labs ordered are listed, but only abnormal results are displayed) Labs Reviewed - No data to display  EKG   Radiology No results found.  Procedures Procedures (including critical care time)  Medications Ordered in UC Medications - No data to display  Initial Impression / Assessment and Plan / UC Course  I have reviewed the triage vital signs and the nursing notes.  Pertinent labs & imaging results that were available during my care of the patient were reviewed by me and considered in my medical decision making (see chart for details).  Acute otitis media Otalgia  -Significant erythema to left ear likely related to recent URI -Amoxicillin twice daily x7 days -Follow-up as needed  Long discussion regarding patient's blood pressure and need for regular follow-up.  Patient to call PCP to arrange appointment. Final Clinical Impressions(s) / UC Diagnoses   Final diagnoses:  Acute otitis media,  unspecified otitis media type     Discharge Instructions     Take antibiotic as prescribed.  Return for any worsening pain or drainage.  Please schedule follow-up with Dr. Caryl Never regarding your blood pressure.  Avoid added salt in your diet  ED Prescriptions    Medication Sig Dispense Auth. Provider   amoxicillin (AMOXIL) 500 MG tablet Take 1 tablet (500 mg total) by mouth 2 (two) times daily for 7 days. 14 tablet Rudolpho Sevin, NP     PDMP not reviewed this encounter.   Rudolpho Sevin, NP 02/23/20 1344

## 2020-02-23 NOTE — ED Triage Notes (Signed)
Pt is here with left ear pain that started yesterday, pt has taken Advil to relieve discomfort.

## 2020-03-15 ENCOUNTER — Other Ambulatory Visit: Payer: Self-pay

## 2020-03-16 ENCOUNTER — Other Ambulatory Visit (HOSPITAL_COMMUNITY)
Admission: RE | Admit: 2020-03-16 | Discharge: 2020-03-16 | Disposition: A | Discharge status: Home / Self Care | Payer: BC Managed Care – PPO | Source: Ambulatory Visit | Attending: Family Medicine | Admitting: Family Medicine

## 2020-03-16 ENCOUNTER — Ambulatory Visit (INDEPENDENT_AMBULATORY_CARE_PROVIDER_SITE_OTHER): Payer: BC Managed Care – PPO | Admitting: Family Medicine

## 2020-03-16 ENCOUNTER — Encounter: Payer: Self-pay | Admitting: Family Medicine

## 2020-03-16 VITALS — BP 120/80 | HR 67 | Ht 72.0 in | Wt 280.0 lb

## 2020-03-16 DIAGNOSIS — Z0000 Encounter for general adult medical examination without abnormal findings: Secondary | ICD-10-CM | POA: Diagnosis not present

## 2020-03-16 DIAGNOSIS — Z113 Encounter for screening for infections with a predominantly sexual mode of transmission: Secondary | ICD-10-CM | POA: Insufficient documentation

## 2020-03-16 LAB — BASIC METABOLIC PANEL
BUN: 16 mg/dL (ref 6–23)
CO2: 27 mEq/L (ref 19–32)
Calcium: 9.8 mg/dL (ref 8.4–10.5)
Chloride: 102 mEq/L (ref 96–112)
Creatinine, Ser: 0.87 mg/dL (ref 0.40–1.50)
GFR: 108.77 mL/min (ref >60.00)
Glucose, Bld: 90 mg/dL (ref 70–99)
Potassium: 3.8 mEq/L (ref 3.5–5.1)
Sodium: 137 mEq/L (ref 135–145)

## 2020-03-16 LAB — LIPID PANEL
Cholesterol: 248 mg/dL — ABNORMAL HIGH (ref 0–200)
HDL: 44.3 mg/dL (ref >39.00)
LDL Cholesterol: 182 mg/dL — ABNORMAL HIGH (ref 0–99)
NonHDL: 203.6
Total CHOL/HDL Ratio: 6
Triglycerides: 110 mg/dL (ref 0.0–149.0)
VLDL: 22 mg/dL (ref 0.0–40.0)

## 2020-03-16 LAB — CBC WITH DIFFERENTIAL/PLATELET
Basophils Absolute: 0 10*3/uL (ref 0.0–0.1)
Basophils Relative: 0.6 % (ref 0.0–3.0)
Eosinophils Absolute: 0.1 10*3/uL (ref 0.0–0.7)
Eosinophils Relative: 2.2 % (ref 0.0–5.0)
HCT: 40.7 % (ref 39.0–52.0)
Hemoglobin: 13.7 g/dL (ref 13.0–17.0)
Lymphocytes Relative: 40.7 % (ref 12.0–46.0)
Lymphs Abs: 1.7 10*3/uL (ref 0.7–4.0)
MCHC: 33.7 g/dL (ref 30.0–36.0)
MCV: 93 fl (ref 78.0–100.0)
Monocytes Absolute: 0.3 10*3/uL (ref 0.1–1.0)
Monocytes Relative: 7.1 % (ref 3.0–12.0)
Neutro Abs: 2.1 10*3/uL (ref 1.4–7.7)
Neutrophils Relative %: 49.4 % (ref 43.0–77.0)
Platelets: 301 10*3/uL (ref 150.0–400.0)
RBC: 4.38 Mil/uL (ref 4.22–5.81)
RDW: 13 % (ref 11.5–15.5)
WBC: 4.2 10*3/uL (ref 4.0–10.5)

## 2020-03-16 LAB — TSH: TSH: 1.18 u[IU]/mL (ref 0.35–4.50)

## 2020-03-16 LAB — HEPATIC FUNCTION PANEL
ALT: 34 U/L (ref 0–53)
AST: 20 U/L (ref 0–37)
Albumin: 4.8 g/dL (ref 3.5–5.2)
Alkaline Phosphatase: 46 U/L (ref 39–117)
Bilirubin, Direct: 0.2 mg/dL (ref 0.0–0.3)
Total Bilirubin: 1.1 mg/dL (ref 0.2–1.2)
Total Protein: 7.1 g/dL (ref 6.0–8.3)

## 2020-03-16 NOTE — Patient Instructions (Signed)
Preventive Care 21-39 Years Old, Male Preventive care refers to lifestyle choices and visits with your health care provider that can promote health and wellness. This includes:  A yearly physical exam. This is also called an annual wellness visit.  Regular dental and eye exams.  Immunizations.  Screening for certain conditions.  Healthy lifestyle choices, such as: ? Eating a healthy diet. ? Getting regular exercise. ? Not using drugs or products that contain nicotine and tobacco. ? Limiting alcohol use. What can I expect for my preventive care visit? Physical exam Your health care provider may check your:  Height and weight. These may be used to calculate your BMI (body mass index). BMI is a measurement that tells if you are at a healthy weight.  Heart rate and blood pressure.  Body temperature.  Skin for abnormal spots. Counseling Your health care provider may ask you questions about your:  Past medical problems.  Family's medical history.  Alcohol, tobacco, and drug use.  Emotional well-being.  Home life and relationship well-being.  Sexual activity.  Diet, exercise, and sleep habits.  Work and work environment.  Access to firearms. What immunizations do I need? Vaccines are usually given at various ages, according to a schedule. Your health care provider will recommend vaccines for you based on your age, medical history, and lifestyle or other factors, such as travel or where you work.   What tests do I need? Blood tests  Lipid and cholesterol levels. These may be checked every 5 years starting at age 20.  Hepatitis C test.  Hepatitis B test. Screening  Diabetes screening. This is done by checking your blood sugar (glucose) after you have not eaten for a while (fasting).  Genital exam to check for testicular cancer or hernias.  STD (sexually transmitted disease) testing, if you are at risk. Talk with your health care provider about your test results,  treatment options, and if necessary, the need for more tests.   Follow these instructions at home: Eating and drinking  Eat a healthy diet that includes fresh fruits and vegetables, whole grains, lean protein, and low-fat dairy products.  Drink enough fluid to keep your urine pale yellow.  Take vitamin and mineral supplements as recommended by your health care provider.  Do not drink alcohol if your health care provider tells you not to drink.  If you drink alcohol: ? Limit how much you have to 0-2 drinks a day. ? Be aware of how much alcohol is in your drink. In the [place] one drink equals one 12 oz bottle of beer (355 mL), one 5 oz glass of wine (148 mL), or one 1 oz glass of hard liquor (44 mL).   Lifestyle  Take daily care of your teeth and gums. Brush your teeth every morning and night with fluoride toothpaste. Floss one time each day.  Stay active. Exercise for at least 30 minutes 5 or more days each week.  Do not use any products that contain nicotine or tobacco, such as cigarettes, e-cigarettes, and chewing tobacco. If you need help quitting, ask your health care provider.  Do not use drugs.  If you are sexually active, practice safe sex. Use a condom or other form of protection to prevent STIs (sexually transmitted infections).  Find healthy ways to cope with stress, such as: ? Meditation, yoga, or listening to music. ? Journaling. ? Talking to a trusted person. ? Spending time with friends and family. Safety  Always wear your seat belt while driving   or riding in a vehicle.  Do not drive: ? If you have been drinking alcohol. Do not ride with someone who has been drinking. ? When you are tired or distracted. ? While texting.  Wear a helmet and other protective equipment during sports activities.  If you have firearms in your house, make sure you follow all gun safety procedures.  Seek help if you have been physically or sexually abused. What's next?  Go to your  health care provider once a year for an annual wellness visit.  Ask your health care provider how often you should have your eyes and teeth checked.  Stay up to date on all vaccines. This information is not intended to replace advice given to you by your health care provider. Make sure you discuss any questions you have with your health care provider. Document Revised: 10/20/2018 Document Reviewed: 01/28/2018 Elsevier Patient Education  2021 [employer]  

## 2020-03-16 NOTE — Progress Notes (Addendum)
Established Patient Office Visit  Subjective:  Patient ID: Darren Knapp, male    DOB: 1980/05/07  Age: 40 y.o. MRN: [id number]  CC:  Chief Complaint  Patient presents with  . Annual Exam    HPI Darren Knapp presents for physical exam. He has history of multiple sclerosis. Followed regularly by neurology. He gets injections regularly with Ocrevus. He has not had Covid immunization apparently advice of his neurologist because of his injection therapy. Fortunately, has not had Covid infection thus far to his knowledge. Generally doing well. He is separated from his wife and has been separated now almost 3 years. They are looking at probably finalizing divorce. Patient does have past history of alcohol abuse but none in some time. He has recently used some marijuana. Denies any other illicit drug use.  He was seen recently urgent care with bilateral cerumen. There apparently was some mention of erythema of the canal and he was placed on amoxicillin. No recent ear drainage. No ear pain at this time.  Health maintenance reviewed  -He declines flu vaccine -Tetanus due 2026 -Covid vaccines declined -No history of hepatitis C screening on record.  Family history-Father with hypertension. Father has history of lung cancer apparently in remission. Mother has hypertension. No family history of diabetes. No known family history of prostate cancer.  Social history-separated past 3 years. No children. Previous history of alcohol abuse but none now. Does use marijuana. No other illicit drugs. Works for [employer]. Has worked for [employer] now for 19 years    Past Medical History:  Diagnosis Date  . Arthritis   . Hypertension   . Multiple sclerosis (HCC) 2011  . Neuromuscular disorder (HCC)   . Obesity     No past surgical history on file.  Family History  Problem Relation Age of Onset  . Hypertension Mother   . Hypertension Father   . Cancer Father 66       lung  . Drug abuse Brother   . Heart  disease Maternal Grandfather     Social History   Socioeconomic History  . Marital status: Single    Spouse name: Not on file  . Number of children: Not on file  . Years of education: Not on file  . Highest education level: Not on file  Occupational History  . Not on file  Tobacco Use  . Smoking status: Former Games developer  . Smokeless tobacco: Never Used  Vaping Use  . Vaping Use: Former  Substance and Sexual Activity  . Alcohol use: Yes    Alcohol/week: 17.0 standard drinks    Types: 17 Cans of beer per week  . Drug use: No  . Sexual activity: Yes    Birth control/protection: None  Other Topics Concern  . Not on file  Social History Narrative  . Not on file   Social Determinants of Health   Financial Resource Strain: Not on file  Food Insecurity: Not on file  Transportation Needs: Not on file  Physical Activity: Not on file  Stress: Not on file  Social Connections: Not on file  Intimate Partner Violence: Not on file    Outpatient Medications Prior to Visit  Medication Sig Dispense Refill  . ibuprofen (ADVIL) 600 MG tablet Take 1 tablet (600 mg total) by mouth every 6 (six) hours as needed. 30 tablet 0  . naproxen (NAPROSYN) 500 MG tablet Take 1 tablet (500 mg total) by mouth 2 (two) times daily. 30 tablet 0  . OCREVUS 300 MG/10ML  injection Inject into the vein.    Marland Kitchen albuterol (VENTOLIN HFA) 108 (90 Base) MCG/ACT inhaler Inhale 1-2 puffs into the lungs every 6 (six) hours as needed for wheezing or shortness of breath. 18 g 0  . benzonatate (TESSALON) 200 MG capsule Take 1 capsule (200 mg total) by mouth 3 (three) times daily as needed for cough. 60 capsule 0  . Dimethyl Fumarate (TECFIDERA) 120 & 240 MG MISC     . fluticasone (FLONASE) 50 MCG/ACT nasal spray Place 2 sprays into both nostrils daily. 16 g 0  . glucosamine-chondroitin 500-400 MG tablet Take 1 tablet by mouth 3 (three) times daily.    . promethazine-dextromethorphan (PROMETHAZINE-DM) 6.25-15 MG/5ML syrup  Take 5 mLs by mouth at bedtime as needed for cough. 100 mL 0  . sodium chloride 0.45 % solution Inject into the vein.    . vitamin C (ASCORBIC ACID) 500 MG tablet Take 500 mg by mouth daily.    Marland Kitchen VITAMIN E PO Take 1 capsule by mouth daily.     No facility-administered medications prior to visit.    Allergies  Allergen Reactions  . Glatiramer Anxiety and Shortness Of Breath    Pressure behind his head  . Iodine-131 Nausea Only  . Ivp Dye [Iodinated Diagnostic Agents] Nausea Only    ROS Review of Systems  Constitutional: Negative for activity change, appetite change, fatigue and fever.  HENT: Negative for congestion, ear pain and trouble swallowing.   Eyes: Negative for pain and visual disturbance.  Respiratory: Negative for cough, shortness of breath and wheezing.   Cardiovascular: Negative for chest pain and palpitations.  Gastrointestinal: Negative for abdominal distention, abdominal pain, blood in stool, constipation, diarrhea, nausea, rectal pain and vomiting.  Endocrine: Negative for polydipsia and polyuria.  Genitourinary: Negative for dysuria, hematuria and testicular pain.  Musculoskeletal: Negative for arthralgias and joint swelling.  Skin: Negative for rash.  Neurological: Negative for dizziness, syncope and headaches.  Hematological: Negative for adenopathy.  Psychiatric/Behavioral: Negative for confusion and dysphoric mood.      Objective:    Physical Exam Constitutional:      General: He is not in acute distress.    Appearance: He is well-developed and well-nourished.  HENT:     Head: Normocephalic and atraumatic.     Comments: Does have some nonobstructing cerumen in both canals. Portions of eardrums visualized appear normal.    Mouth/Throat:     Mouth: Oropharynx is clear and moist.  Eyes:     Extraocular Movements: EOM normal.     Conjunctiva/sclera: Conjunctivae normal.     Pupils: Pupils are equal, round, and reactive to light.  Neck:     Thyroid: No  thyromegaly.  Cardiovascular:     Rate and Rhythm: Normal rate and regular rhythm.     Heart sounds: Normal heart sounds. No murmur heard.   Pulmonary:     Effort: No respiratory distress.     Breath sounds: No wheezing or rales.  Abdominal:     General: Bowel sounds are normal. There is no distension.     Palpations: Abdomen is soft. There is no mass.     Tenderness: There is no abdominal tenderness. There is no guarding or rebound.  Musculoskeletal:        General: No edema.     Cervical back: Normal range of motion and neck supple.     Right lower leg: No edema.     Left lower leg: No edema.  Lymphadenopathy:  Cervical: No cervical adenopathy.  Skin:    Findings: No rash.  Neurological:     Mental Status: He is alert and oriented to person, place, and time.     Cranial Nerves: No cranial nerve deficit.  Psychiatric:        Mood and Affect: Mood and affect normal.     BP 120/80   Pulse 67   Ht 6' (1.829 m)   Wt 280 lb (127 kg)   SpO2 99%   BMI 37.97 kg/m  Wt Readings from Last 3 Encounters:  03/16/20 280 lb (127 kg)  11/03/18 293 lb 3.2 oz (133 kg)  04/16/18 297 lb 4.8 oz (134.9 kg)     Health Maintenance Due  Topic Date Due  . Hepatitis C Screening  Never done    There are no preventive care reminders to display for this patient.  Lab Results  Component Value Date   TSH 1.39 03/12/2018   Lab Results  Component Value Date   WBC 4.7 03/12/2018   HGB 13.7 03/12/2018   HCT 40.3 03/12/2018   MCV 94.1 03/12/2018   PLT 297.0 03/12/2018   Lab Results  Component Value Date   NA 139 03/12/2018   K 4.5 03/12/2018   CO2 30 03/12/2018   GLUCOSE 92 03/12/2018   BUN 17 03/12/2018   CREATININE 0.99 03/12/2018   BILITOT 0.7 03/12/2018   ALKPHOS 48 03/12/2018   AST 23 03/12/2018   ALT 37 03/12/2018   PROT 7.0 03/12/2018   ALBUMIN 4.6 03/12/2018   CALCIUM 10.0 03/12/2018   GFR 102.70 03/12/2018   Lab Results  Component Value Date   CHOL 241 (H)  03/12/2018   Lab Results  Component Value Date   HDL 49.70 03/12/2018   Lab Results  Component Value Date   LDLCALC 157 (H) 03/12/2018   Lab Results  Component Value Date   TRIG 168.0 (H) 03/12/2018   Lab Results  Component Value Date   CHOLHDL 5 03/12/2018   No results found for: HGBA1C    Assessment & Plan:   Problem List Items Addressed This Visit   None   Visit Diagnoses    Physical exam    -  Primary   Relevant Orders   Basic metabolic panel   Lipid panel   CBC with Differential/Platelet   TSH   Hepatic function panel   Hep C Antibody   HIV antibody (with reflex)   RPR   Urine cytology ancillary only    He has history of MS and is followed regularly by neurology for that. We discussed the following health maintenance issues  -Recommend regular exercise and try to lose some weight -Obtain screening labs -Discussed concerns regarding marijuana use particularly that this not be a "Gateway" type drug because of his past history of abuse with alcohol. He understands importance of that addiction history -Blood pressure was up recently at urgent care but much improved today. -Patient also requesting STD screening.  No orders of the defined types were placed in this encounter.   Follow-up: No follow-ups on file.    Evelena Peat, MD

## 2020-03-19 LAB — URINE CYTOLOGY ANCILLARY ONLY
Chlamydia: NEGATIVE
Comment: NEGATIVE
Comment: NEGATIVE
Comment: NORMAL
Neisseria Gonorrhea: NEGATIVE
Trichomonas: NEGATIVE

## 2020-03-19 LAB — HIV ANTIBODY (ROUTINE TESTING W REFLEX): HIV 1&2 Ab, 4th Generation: NONREACTIVE

## 2020-03-19 LAB — HEPATITIS C ANTIBODY
Hepatitis C Ab: NONREACTIVE
SIGNAL TO CUT-OFF: 0.01 (ref <1.00)

## 2020-03-19 LAB — RPR: RPR Ser Ql: NONREACTIVE

## 2020-03-19 NOTE — Progress Notes (Signed)
Mychart message sent: STD screens all negative.

## 2020-03-19 NOTE — Progress Notes (Signed)
Mychart message sent: Labs all OK except for elevated cholesterol. Keep saturated fats down and try to lose a few  pounds.

## 2020-11-01 ENCOUNTER — Other Ambulatory Visit: Payer: Self-pay

## 2020-11-02 ENCOUNTER — Ambulatory Visit (INDEPENDENT_AMBULATORY_CARE_PROVIDER_SITE_OTHER): Payer: BC Managed Care – PPO | Admitting: Family Medicine

## 2020-11-02 VITALS — BP 140/80 | HR 75 | Temp 98.3°F | Wt 268.3 lb

## 2020-11-02 DIAGNOSIS — R42 Dizziness and giddiness: Secondary | ICD-10-CM | POA: Diagnosis not present

## 2020-11-02 NOTE — Progress Notes (Signed)
Established Patient Office Visit  Subjective:  Patient ID: Darren Knapp, male    DOB: 08/23/1980  Age: 40 y.o. MRN: [id number]  CC:  Chief Complaint  Patient presents with   Dizziness    X 1 week, comes and goes, usually at night or early morning, has improved some    HPI Darren Knapp presents for dizziness intermittently for the past week.  Symptoms have actually improved somewhat past couple days.  Dizziness was intermittent.  Not clearly positional.  Not describing orthostatic type symptoms.  No associated nausea, vomiting, or diarrhea.  No heart palpitations.  No chest pain.  He does have history of MS.  Denies any recent visual changes.  No focal weakness.  No dysarthria.  No dysphagia.  No ataxia.  He recalls having something similar once previously which eventually resolved on its own.  He has had prior history of alcohol abuse and had a few drinks 1 day last week but not drinking consistently.  Rare marijuana use in the past but not consistently.  Denies any other illicit drugs.  Past Medical History:  Diagnosis Date   Arthritis    Hypertension    Multiple sclerosis (HCC) 2011   Neuromuscular disorder (HCC)    Obesity     No past surgical history on file.  Family History  Problem Relation Age of Onset   Hypertension Mother    Hypertension Father    Cancer Father 25       lung   Drug abuse Brother    Heart disease Maternal Grandfather     Social History   Socioeconomic History   Marital status: Single    Spouse name: Not on file   Number of children: Not on file   Years of education: Not on file   Highest education level: Not on file  Occupational History   Not on file  Tobacco Use   Smoking status: Former   Smokeless tobacco: Never  Vaping Use   Vaping Use: Former  Substance and Sexual Activity   Alcohol use: Yes    Alcohol/week: 17.0 standard drinks    Types: 17 Cans of beer per week   Drug use: No   Sexual activity: Yes    Birth  control/protection: None  Other Topics Concern   Not on file  Social History Narrative   Not on file   Social Determinants of Health   Financial Resource Strain: Not on file  Food Insecurity: Not on file  Transportation Needs: Not on file  Physical Activity: Not on file  Stress: Not on file  Social Connections: Not on file  Intimate Partner Violence: Not on file    Outpatient Medications Prior to Visit  Medication Sig Dispense Refill   ibuprofen (ADVIL) 600 MG tablet Take 1 tablet (600 mg total) by mouth every 6 (six) hours as needed. 30 tablet 0   naproxen (NAPROSYN) 500 MG tablet Take 1 tablet (500 mg total) by mouth 2 (two) times daily. 30 tablet 0   OCREVUS 300 MG/10ML injection Inject into the vein.     No facility-administered medications prior to visit.    Allergies  Allergen Reactions   Glatiramer Anxiety and Shortness Of Breath    Pressure behind his head   Iodine-131 Nausea Only   Ivp Dye [Iodinated Diagnostic Agents] Nausea Only    ROS Review of Systems  Constitutional:  Negative for chills and fever.  Eyes:  Negative for visual disturbance.  Respiratory:  Negative for shortness of breath.  Cardiovascular:  Negative for chest pain.  Genitourinary:  Negative for dysuria.  Neurological:  Positive for dizziness. Negative for tremors, seizures, syncope, facial asymmetry, speech difficulty, weakness and headaches.  Psychiatric/Behavioral:  Negative for confusion.      Objective:    Physical Exam Vitals reviewed.  Constitutional:      Appearance: Normal appearance.  Eyes:     Extraocular Movements: Extraocular movements intact.     Pupils: Pupils are equal, round, and reactive to light.  Cardiovascular:     Rate and Rhythm: Normal rate and regular rhythm.     Heart sounds: No murmur heard. Pulmonary:     Effort: Pulmonary effort is normal.     Breath sounds: Normal breath sounds.  Musculoskeletal:     Cervical back: Neck supple.     Right lower leg:  No edema.     Left lower leg: No edema.  Lymphadenopathy:     Cervical: No cervical adenopathy.  Neurological:     General: No focal deficit present.     Mental Status: He is alert and oriented to person, place, and time.     Cranial Nerves: No cranial nerve deficit.     Motor: No weakness.     Gait: Gait normal.     Comments: Does have some transient vertigo when going from seated to supine with right head tilt 45 degrees    BP 140/80 (BP Location: Left Arm, Patient Position: Sitting, Cuff Size: Normal)   Pulse 75   Temp 98.3 F (36.8 C) (Oral)   Wt 268 lb 4.8 oz (121.7 kg)   SpO2 97%   BMI 36.39 kg/m  Wt Readings from Last 3 Encounters:  11/02/20 268 lb 4.8 oz (121.7 kg)  03/16/20 280 lb (127 kg)  11/03/18 293 lb 3.2 oz (133 kg)     Health Maintenance Due  Topic Date Due   COVID-19 Vaccine (1) Never done    There are no preventive care reminders to display for this patient.  Lab Results  Component Value Date   TSH 1.18 03/16/2020   Lab Results  Component Value Date   WBC 4.2 03/16/2020   HGB 13.7 03/16/2020   HCT 40.7 03/16/2020   MCV 93.0 03/16/2020   PLT 301.0 03/16/2020   Lab Results  Component Value Date   NA 137 03/16/2020   K 3.8 03/16/2020   CO2 27 03/16/2020   GLUCOSE 90 03/16/2020   BUN 16 03/16/2020   CREATININE 0.87 03/16/2020   BILITOT 1.1 03/16/2020   ALKPHOS 46 03/16/2020   AST 20 03/16/2020   ALT 34 03/16/2020   PROT 7.1 03/16/2020   ALBUMIN 4.8 03/16/2020   CALCIUM 9.8 03/16/2020   GFR 108.77 03/16/2020   Lab Results  Component Value Date   CHOL 248 (H) 03/16/2020   Lab Results  Component Value Date   HDL 44.30 03/16/2020   Lab Results  Component Value Date   LDLCALC 182 (H) 03/16/2020   Lab Results  Component Value Date   TRIG 110.0 03/16/2020   Lab Results  Component Value Date   CHOLHDL 6 03/16/2020   No results found for: HGBA1C    Assessment & Plan:   Intermittent dizziness for 1 week.  Patient does have  some mild transient reproducible vertigo on exam as above.  Suspect benign peripheral vertigo.  -We discussed Epley maneuvers with handout given -Reviewed signs and symptoms of more worrisome vertigo to watch out for such as ataxia, focal weakness, recurrent vomiting, speech  difficulty, dysphagia -We also suggest that he consult with his neurologist if symptoms not resolved in 1 week   No orders of the defined types were placed in this encounter.   Follow-up: No follow-ups on file.    Evelena Peat, MD

## 2021-04-19 ENCOUNTER — Encounter: Payer: Self-pay | Admitting: Family Medicine

## 2021-04-19 ENCOUNTER — Ambulatory Visit (INDEPENDENT_AMBULATORY_CARE_PROVIDER_SITE_OTHER): Payer: BC Managed Care – PPO | Admitting: Family Medicine

## 2021-04-19 VITALS — BP 128/80 | HR 69 | Temp 98.0°F | Ht 72.0 in | Wt 285.1 lb

## 2021-04-19 DIAGNOSIS — Z125 Encounter for screening for malignant neoplasm of prostate: Secondary | ICD-10-CM

## 2021-04-19 DIAGNOSIS — Z0000 Encounter for general adult medical examination without abnormal findings: Secondary | ICD-10-CM

## 2021-04-19 LAB — TSH: TSH: 2.12 u[IU]/mL (ref 0.35–5.50)

## 2021-04-19 LAB — LIPID PANEL
Cholesterol: 249 mg/dL — ABNORMAL HIGH (ref 0–200)
HDL: 46.9 mg/dL (ref >39.00)
NonHDL: 202.53
Total CHOL/HDL Ratio: 5
Triglycerides: 264 mg/dL — ABNORMAL HIGH (ref 0.0–149.0)
VLDL: 52.8 mg/dL — ABNORMAL HIGH (ref 0.0–40.0)

## 2021-04-19 LAB — BASIC METABOLIC PANEL
BUN: 16 mg/dL (ref 6–23)
CO2: 31 mEq/L (ref 19–32)
Calcium: 9.8 mg/dL (ref 8.4–10.5)
Chloride: 100 mEq/L (ref 96–112)
Creatinine, Ser: 0.96 mg/dL (ref 0.40–1.50)
GFR: 98.88 mL/min (ref >60.00)
Glucose, Bld: 94 mg/dL (ref 70–99)
Potassium: 4.3 mEq/L (ref 3.5–5.1)
Sodium: 138 mEq/L (ref 135–145)

## 2021-04-19 LAB — HEMOGLOBIN A1C: Hgb A1c MFr Bld: 5.6 % (ref 4.6–6.5)

## 2021-04-19 LAB — PSA: PSA: 0.25 ng/mL (ref 0.10–4.00)

## 2021-04-19 LAB — LDL CHOLESTEROL, DIRECT: Direct LDL: 154 mg/dL

## 2021-04-19 MED ORDER — VITAMIN D (ERGOCALCIFEROL) 1.25 MG (50000 UNIT) PO CAPS: 50000.0000 [IU] | ORAL_CAPSULE | ORAL | 3 refills | Status: DC

## 2021-04-19 NOTE — Patient Instructions (Signed)
Start the Vit D prescription- one weekly ? ?Consider OTC B12 1,000 mcg daily ?

## 2021-04-19 NOTE — Progress Notes (Addendum)
? ?Established Patient Office Visit ? ?Subjective:  ?Patient ID: Darren Knapp, male    DOB: 1981/01/24  Age: 41 y.o. MRN: [id number] ? ?CC:  ?Chief Complaint  ?Patient presents with  ? Annual Exam  ? ? ?HPI ?Darren Knapp presents for physical exam.  He has history of MS followed over at [facility].  He had recent labs done and these were reviewed.  He had B12 level 229.  Vitamin D was less than 8.  Hepatic panel normal.  CBC hemoglobin 13.5 which is stable for him. ?He is on immunosuppressant with Ocrevus every 6 months and MS symptoms stable. ? ?Health maintenance reviewed: ? ?-Declines flu vaccine ?-Tetanus up-to-date ?-No indication for colonoscopy until age 19 has no family history of colon cancer. ? ?Social history-he is divorced.  No children.  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  Non-smoker.  Past history of alcohol abuse but currently clean. ? ?Family history-both parents with hypertension.  Father with history of lung cancer.  His father also has type 2 diabetes.  No family history of premature CAD.  He has 2 brothers and 2 sisters.  1 brother with history of drug abuse. ? ?The 10-year ASCVD risk score (Arnett DK, et al., 2019) is: 3.4% ?  Values used to calculate the score: ?    Age: 15 years ?    Sex: Male ?    Is Non-Hispanic African American: Yes ?    Diabetic: No ?    Tobacco smoker: No ?    Systolic Blood Pressure: 128 mmHg ?    Is BP treated: No ?    HDL Cholesterol: 46.9 mg/dL ?    Total Cholesterol: 249 mg/dL ? ? ?Past Medical History:  ?Diagnosis Date  ? Arthritis   ? Hypertension   ? Multiple sclerosis (HCC) 2011  ? Neuromuscular disorder (HCC)   ? Obesity   ? ? ?History reviewed. No pertinent surgical history. ? ?Family History  ?Problem Relation Age of Onset  ? Arthritis Mother   ? Hypertension Mother   ? Diabetes Father   ? Hypertension Father   ? Cancer Father 52  ?     lung  ? Drug abuse Brother   ? Heart disease Maternal Grandfather   ? ? ?Social History   ? ?Socioeconomic History  ? Marital status: Single  ?  Spouse name: Not on file  ? Number of children: Not on file  ? Years of education: Not on file  ? Highest education level: Not on file  ?Occupational History  ? Not on file  ?Tobacco Use  ? Smoking status: Former  ? Smokeless tobacco: Never  ?Vaping Use  ? Vaping Use: Former  ?Substance and Sexual Activity  ? Alcohol use: Yes  ?  Alcohol/week: 17.0 standard drinks  ?  Types: 17 Cans of beer per week  ? Drug use: No  ? Sexual activity: Yes  ?  Birth control/protection: None  ?Other Topics Concern  ? Not on file  ?Social History Narrative  ? Not on file  ? ?Social Determinants of Health  ? ?Financial Resource Strain: Not on file  ?Food Insecurity: Not on file  ?Transportation Needs: Not on file  ?Physical Activity: Not on file  ?Stress: Not on file  ?Social Connections: Not on file  ?Intimate Partner Violence: Not on file  ? ? ?Outpatient Medications Prior to Visit  ?Medication Sig Dispense Refill  ? ibuprofen (ADVIL) 600 MG tablet  Take 1 tablet (600 mg total) by mouth every 6 (six) hours as needed. 30 tablet 0  ? naproxen (NAPROSYN) 500 MG tablet Take 1 tablet (500 mg total) by mouth 2 (two) times daily. 30 tablet 0  ? OCREVUS 300 MG/10ML injection Inject into the vein.    ? ?No facility-administered medications prior to visit.  ? ? ?Allergies  ?Allergen Reactions  ? Glatiramer Anxiety and Shortness Of Breath  ?  Pressure behind his head  ? Iodine-131 Nausea Only  ? Ivp Dye [Iodinated Contrast Media] Nausea Only  ? ? ?ROS ?Review of Systems  ?Constitutional:  Negative for activity change, appetite change, fatigue and fever.  ?HENT:  Negative for congestion, ear pain and trouble swallowing.   ?Eyes:  Negative for pain and visual disturbance.  ?Respiratory:  Negative for cough, shortness of breath and wheezing.   ?Cardiovascular:  Negative for chest pain and palpitations.  ?Gastrointestinal:  Negative for abdominal distention, abdominal pain, blood in stool,  constipation, diarrhea, nausea, rectal pain and vomiting.  ?Endocrine: Negative for polydipsia and polyuria.  ?Genitourinary:  Negative for dysuria, hematuria and testicular pain.  ?Musculoskeletal:  Negative for arthralgias and joint swelling.  ?Skin:  Negative for rash.  ?Neurological:  Negative for dizziness, syncope and headaches.  ?Hematological:  Negative for adenopathy.  ?Psychiatric/Behavioral:  Negative for confusion and dysphoric mood.   ? ?  ?Objective:  ?  ?Physical Exam ?Constitutional:   ?   General: He is not in acute distress. ?   Appearance: He is well-developed.  ?HENT:  ?   Head: Normocephalic and atraumatic.  ?   Right Ear: External ear normal.  ?   Left Ear: External ear normal.  ?Eyes:  ?   Conjunctiva/sclera: Conjunctivae normal.  ?   Pupils: Pupils are equal, round, and reactive to light.  ?Neck:  ?   Thyroid: No thyromegaly.  ?Cardiovascular:  ?   Rate and Rhythm: Normal rate and regular rhythm.  ?   Heart sounds: Normal heart sounds. No murmur heard. ?Pulmonary:  ?   Effort: No respiratory distress.  ?   Breath sounds: No wheezing or rales.  ?Abdominal:  ?   General: Bowel sounds are normal. There is no distension.  ?   Palpations: Abdomen is soft. There is no mass.  ?   Tenderness: There is no abdominal tenderness. There is no guarding or rebound.  ?Musculoskeletal:  ?   Cervical back: Normal range of motion and neck supple.  ?Lymphadenopathy:  ?   Cervical: No cervical adenopathy.  ?Skin: ?   Findings: No rash.  ?Neurological:  ?   Mental Status: He is alert and oriented to person, place, and time.  ?   Cranial Nerves: No cranial nerve deficit.  ? ? ?BP 128/80   Pulse 69   Temp 98 ?F (36.7 ?C) (Oral)   Ht 6' (1.829 m)   Wt 285 lb 1 oz (129.3 kg)   SpO2 98%   BMI 38.66 kg/m?  ?Wt Readings from Last 3 Encounters:  ?04/19/21 285 lb 1 oz (129.3 kg)  ?11/02/20 268 lb 4.8 oz (121.7 kg)  ?03/16/20 280 lb (127 kg)  ? ? ? ?There are no preventive care reminders to display for this  patient. ? ?There are no preventive care reminders to display for this patient. ? ?Lab Results  ?Component Value Date  ? TSH 1.18 03/16/2020  ? ?Lab Results  ?Component Value Date  ? WBC 4.2 03/16/2020  ? HGB 13.7 03/16/2020  ?  HCT 40.7 03/16/2020  ? MCV 93.0 03/16/2020  ? PLT 301.0 03/16/2020  ? ?Lab Results  ?Component Value Date  ? NA 137 03/16/2020  ? K 3.8 03/16/2020  ? CO2 27 03/16/2020  ? GLUCOSE 90 03/16/2020  ? BUN 16 03/16/2020  ? CREATININE 0.87 03/16/2020  ? BILITOT 1.1 03/16/2020  ? ALKPHOS 46 03/16/2020  ? AST 20 03/16/2020  ? ALT 34 03/16/2020  ? PROT 7.1 03/16/2020  ? ALBUMIN 4.8 03/16/2020  ? CALCIUM 9.8 03/16/2020  ? GFR 108.77 03/16/2020  ? ?Lab Results  ?Component Value Date  ? CHOL 248 (H) 03/16/2020  ? ?Lab Results  ?Component Value Date  ? HDL 44.30 03/16/2020  ? ?Lab Results  ?Component Value Date  ? LDLCALC 182 (H) 03/16/2020  ? ?Lab Results  ?Component Value Date  ? TRIG 110.0 03/16/2020  ? ?Lab Results  ?Component Value Date  ? CHOLHDL 6 03/16/2020  ? ?No results found for: HGBA1C ? ?  ?Assessment & Plan:  ? ?Problem List Items Addressed This Visit   ?None ?Visit Diagnoses   ? ? Physical exam    -  Primary  ? Relevant Orders  ? Basic metabolic panel  ? Lipid panel  ? TSH  ? Hemoglobin A1c  ? PSA  ? ?  ?History of MS followed over at [facility].  He had recent labs as above.  Very low vitamin D level and low normal B12.  We discussed the following issues ? ?-Recommend starting vitamin D 50,000 international units once weekly and consider 70-month follow-up and recheck vitamin D level ?-Consider over-the-counter B12 1000 mcg daily ?-Flu vaccine discussed and patient declines ?-Obtain labs as above.  We did not check CBC or hepatic panel as he had these recently [facility] ? ?Meds ordered this encounter  ?Medications  ? Vitamin D, Ergocalciferol, (DRISDOL) 1.25 MG (50000 UNIT) CAPS capsule  ?  Sig: Take 1 capsule (50,000 Units total) by mouth every 7 (seven) days.  ?  Dispense:  12  capsule  ?  Refill:  3  ? ? ?Follow-up: No follow-ups on file.  ? ? ?Evelena Peat, MD ?

## 2021-10-18 ENCOUNTER — Ambulatory Visit (INDEPENDENT_AMBULATORY_CARE_PROVIDER_SITE_OTHER): Payer: BC Managed Care – PPO | Admitting: Family Medicine

## 2021-10-18 ENCOUNTER — Encounter: Payer: Self-pay | Admitting: Family Medicine

## 2021-10-18 VITALS — BP 140/100 | HR 62 | Temp 98.1°F | Ht 72.0 in | Wt 271.5 lb

## 2021-10-18 DIAGNOSIS — R030 Elevated blood-pressure reading, without diagnosis of hypertension: Secondary | ICD-10-CM | POA: Diagnosis not present

## 2021-10-18 DIAGNOSIS — E559 Vitamin D deficiency, unspecified: Secondary | ICD-10-CM | POA: Diagnosis not present

## 2021-10-18 MED ORDER — VITAMIN D (ERGOCALCIFEROL) 1.25 MG (50000 UNIT) PO CAPS: 50000.0000 [IU] | ORAL_CAPSULE | ORAL | 3 refills | Status: DC

## 2021-10-18 NOTE — Progress Notes (Signed)
Established Patient Office Visit  Subjective   Patient ID: Darren Knapp, male    DOB: 1980-08-14  Age: 41 y.o. MRN: [id number]  Chief Complaint  Patient presents with   Follow-up    HPI   Seen for the following items  He has history of MS.  Followed by neurology.  Had recent vitamin D level of 12.  Currently not taking any vitamin D.  Denies any myalgias.  Elevated blood pressure.  Did have couple shots of whiskey last night.  Not drinking regularly.  Never had elevated blood pressures previously.  No headaches.  No dizziness.  No chest pains.  No peripheral edema.  Past Medical History:  Diagnosis Date   Arthritis    Hypertension    Multiple sclerosis (HCC) 2011   Neuromuscular disorder (HCC)    Obesity    History reviewed. No pertinent surgical history.  reports that he has quit smoking. He has never used smokeless tobacco. He reports current alcohol use of about 17.0 standard drinks of alcohol per week. He reports that he does not use drugs. family history includes Arthritis in his mother; Cancer (age of onset: 93) in his father; Diabetes in his father; Drug abuse in his brother; Heart disease in his maternal grandfather; Hypertension in his father and mother. Allergies  Allergen Reactions   Glatiramer Anxiety and Shortness Of Breath    Pressure behind his head   Iodine-131 Nausea Only   Ivp Dye [Iodinated Contrast Media] Nausea Only    Review of Systems  Constitutional:  Negative for malaise/fatigue.  Eyes:  Negative for blurred vision.  Respiratory:  Negative for shortness of breath.   Cardiovascular:  Negative for chest pain.  Neurological:  Negative for dizziness, weakness and headaches.      Objective:     BP (!) 140/100 (BP Location: Left Arm, Cuff Size: Large)   Pulse 62   Temp 98.1 F (36.7 C) (Oral)   Ht 6' (1.829 m)   Wt 271 lb 8 oz (123.2 kg)   SpO2 100%   BMI 36.82 kg/m    Physical Exam Constitutional:      Appearance: He is  well-developed.  HENT:     Right Ear: External ear normal.     Left Ear: External ear normal.  Eyes:     Pupils: Pupils are equal, round, and reactive to light.  Neck:     Thyroid: No thyromegaly.  Cardiovascular:     Rate and Rhythm: Normal rate and regular rhythm.  Pulmonary:     Effort: Pulmonary effort is normal. No respiratory distress.     Breath sounds: Normal breath sounds. No wheezing or rales.  Musculoskeletal:     Cervical back: Neck supple.  Neurological:     Mental Status: He is alert and oriented to person, place, and time.      No results found for any visits on 10/18/21.    The 10-year ASCVD risk score (Arnett DK, et al., 2019) is: 4%    Assessment & Plan:   #1 vitamin D deficiency.  Recent 25-hydroxy vitamin D level 12.  Start vitamin D replacement 50,000 international units once weekly.  Set up 35-month follow-up and recheck at that time  #2 elevated blood pressure.  No prior diagnosis of hypertension.  Work on weight loss.  Establish more consistent exercise.  Try to keep daily sodium intake less than 2500 mg.  Handout on DASH diet given.  Recommend home monitoring.  Be in touch if consistently  greater than 140/90  Return in about 3 months (around 01/17/2022).    Evelena Peat, MD

## 2022-01-11 ENCOUNTER — Ambulatory Visit (HOSPITAL_COMMUNITY)
Admission: EM | Admit: 2022-01-11 | Discharge: 2022-01-11 | Disposition: A | Discharge status: Home / Self Care | Payer: BC Managed Care – PPO | Attending: Emergency Medicine | Admitting: Emergency Medicine

## 2022-01-11 ENCOUNTER — Ambulatory Visit (INDEPENDENT_AMBULATORY_CARE_PROVIDER_SITE_OTHER): Payer: BC Managed Care – PPO

## 2022-01-11 ENCOUNTER — Encounter (HOSPITAL_COMMUNITY): Payer: Self-pay | Admitting: Emergency Medicine

## 2022-01-11 DIAGNOSIS — M1712 Unilateral primary osteoarthritis, left knee: Secondary | ICD-10-CM

## 2022-01-11 DIAGNOSIS — M25561 Pain in right knee: Secondary | ICD-10-CM | POA: Diagnosis not present

## 2022-01-11 MED ORDER — METHYLPREDNISOLONE 4 MG PO TBPK: ORAL_TABLET | ORAL | 0 refills | Status: DC

## 2022-01-11 MED ORDER — MELOXICAM 7.5 MG PO TABS: 7.5000 mg | ORAL_TABLET | Freq: Every day | ORAL | 2 refills | Status: DC

## 2022-01-11 NOTE — ED Triage Notes (Signed)
Pt reports right knee pain and swelling x 3 days. Denies any obvious injuries.

## 2022-01-11 NOTE — Discharge Instructions (Addendum)
The x-ray of your left knee reveals degenerative changes in your knee joint.  This is also commonly referred to as osteoarthritis.  To treat the acute swelling and pain in your left knee at this time, I recommend that you begin taking methylprednisolone by taking 1 row tablets every day with food, preferably in the morning.  Once you have completed the methylprednisolone Dosepak, please begin taking meloxicam 7.5 mg daily until you follow-up with your primary care provider.
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
## 2022-01-13 IMAGING — DX DG CHEST 2V
2 series · 2 of 2 positions shown · non-contrast
Comparison: June 26, 2008.

CLINICAL DATA: Chest pain.

EXAM:
CHEST - 2 VIEW

[chest pa]
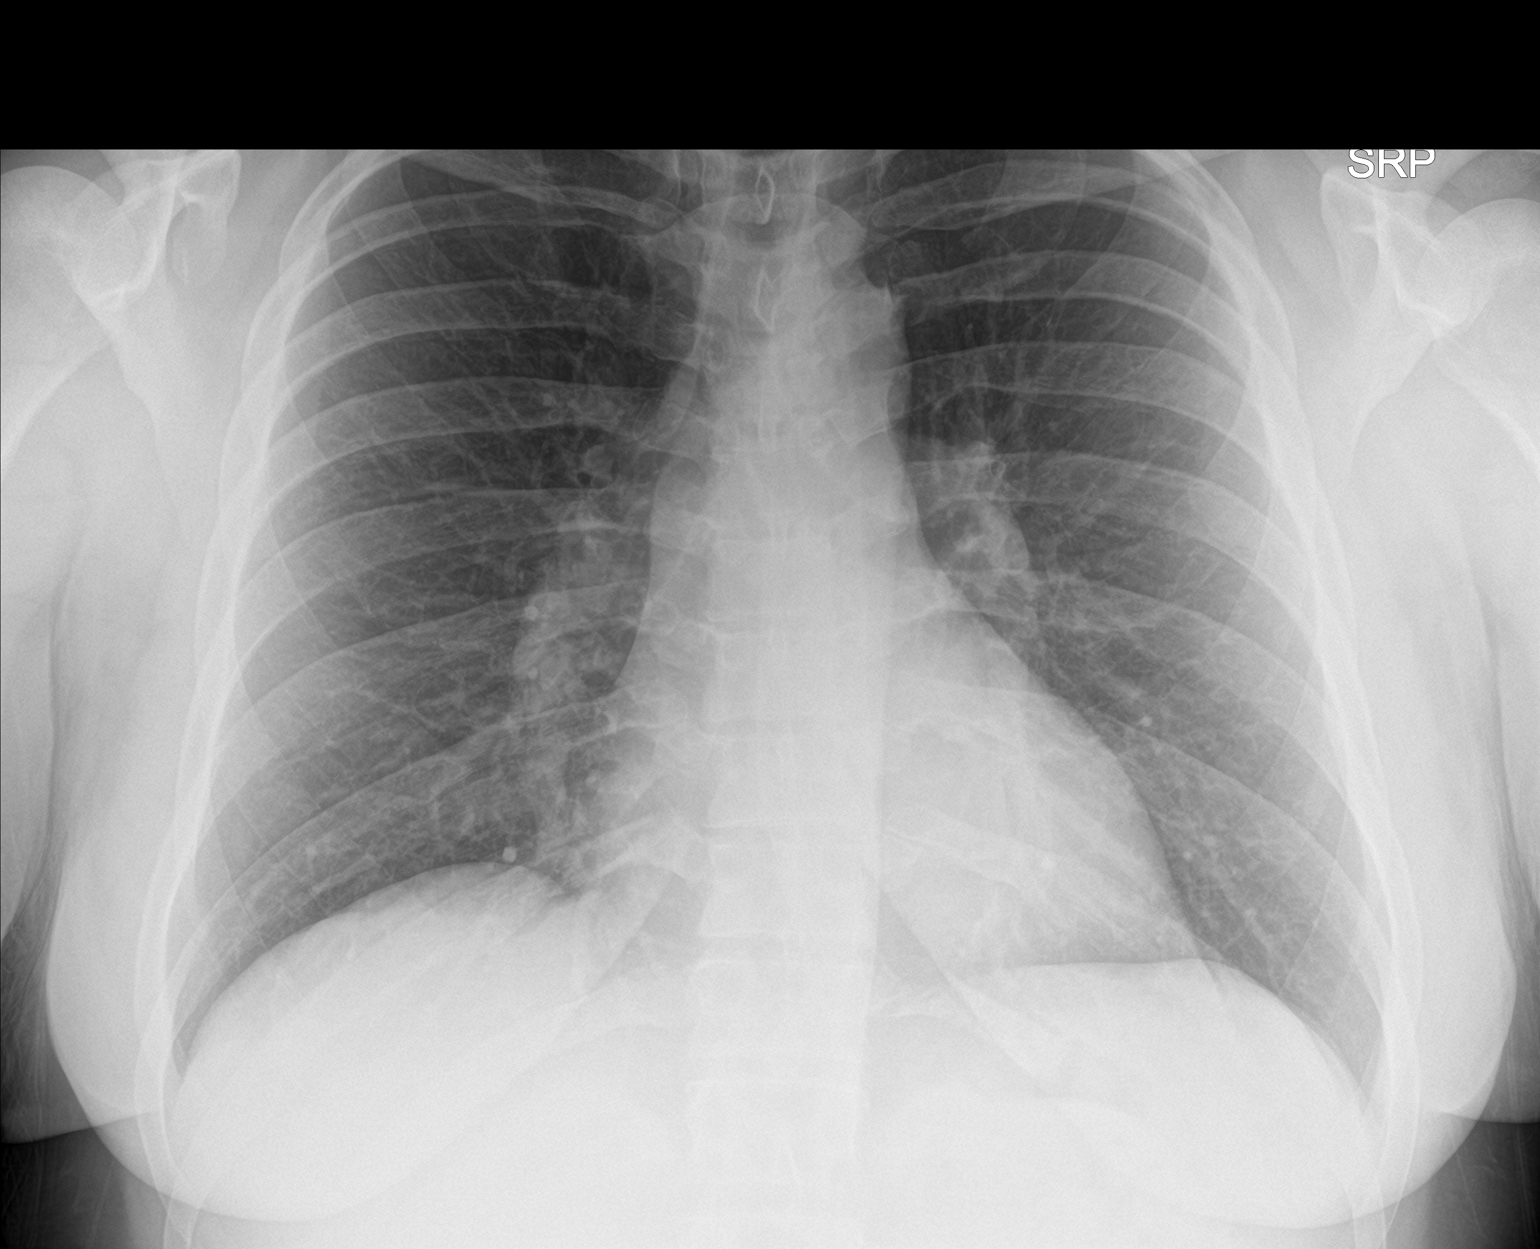

[chest lat]
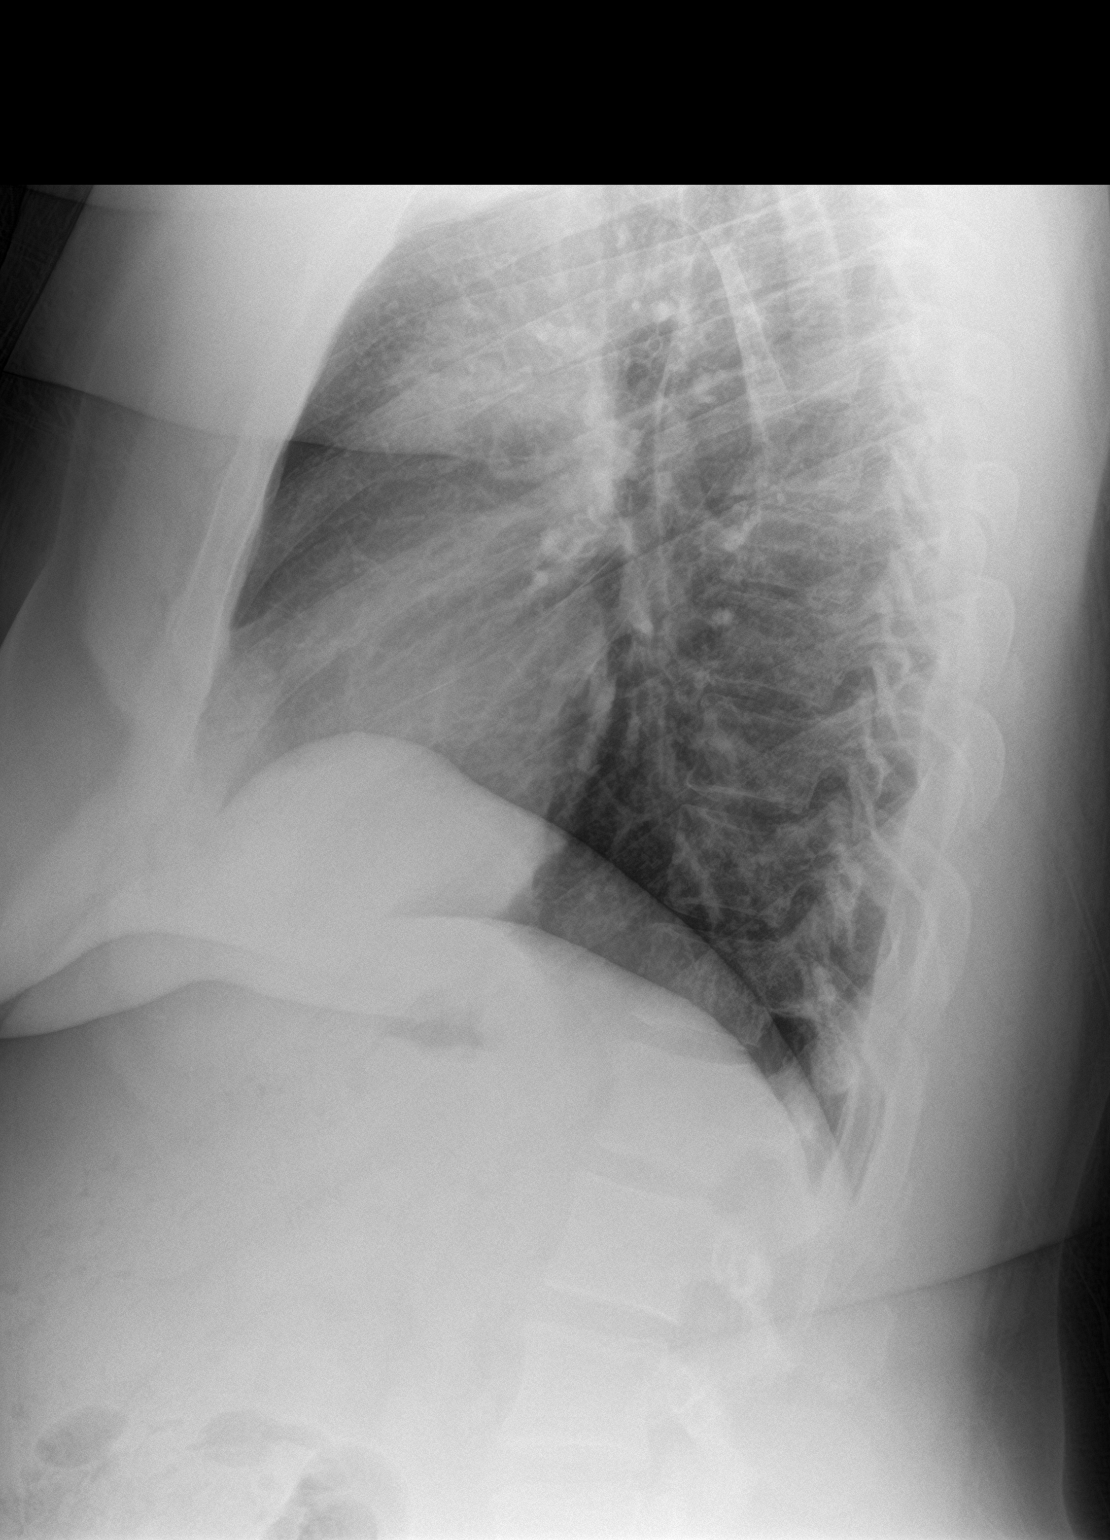

[2 of 2 positions shown; findings below may reference images not displayed]

FINDINGS: The heart size and mediastinal contours are within normal limits.
Both lungs are clear. No pneumothorax or pleural effusion is noted.
The visualized skeletal structures are unremarkable.
IMPRESSION: No active cardiopulmonary disease.

## 2022-03-25 ENCOUNTER — Ambulatory Visit
Admission: EM | Admit: 2022-03-25 | Discharge: 2022-03-25 | Disposition: A | Discharge status: Home / Self Care | Payer: BC Managed Care – PPO | Attending: Internal Medicine | Admitting: Internal Medicine

## 2022-03-25 DIAGNOSIS — Z23 Encounter for immunization: Secondary | ICD-10-CM

## 2022-03-25 DIAGNOSIS — R030 Elevated blood-pressure reading, without diagnosis of hypertension: Secondary | ICD-10-CM | POA: Diagnosis not present

## 2022-03-25 DIAGNOSIS — T2040XA Corrosion of unspecified degree of head, face, and neck, unspecified site, initial encounter: Secondary | ICD-10-CM

## 2022-03-25 MED ORDER — TETANUS-DIPHTH-ACELL PERTUSSIS 5-2.5-18.5 LF-MCG/0.5 IM SUSY
0.5000 mL | PREFILLED_SYRINGE | Freq: Once | INTRAMUSCULAR | Status: AC
  Administered 2022-03-25: 0.5 mL via INTRAMUSCULAR

## 2022-03-25 NOTE — Discharge Instructions (Signed)
Please use cool compresses to affected area.  Monitor for any worsening signs and follow-up if they occur.  Follow-up with family doctor for elevated blood pressure and monitor at home.
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
## 2022-03-28 ENCOUNTER — Encounter: Payer: Self-pay | Admitting: Family Medicine

## 2022-03-28 ENCOUNTER — Ambulatory Visit (INDEPENDENT_AMBULATORY_CARE_PROVIDER_SITE_OTHER): Payer: BC Managed Care – PPO | Admitting: Family Medicine

## 2022-03-28 VITALS — BP 156/100 | HR 70 | Temp 98.5°F | Ht 72.0 in | Wt 277.4 lb

## 2022-03-28 DIAGNOSIS — I10 Essential (primary) hypertension: Secondary | ICD-10-CM

## 2022-03-28 MED ORDER — AMLODIPINE BESYLATE 5 MG PO TABS: 5.0000 mg | ORAL_TABLET | Freq: Every day | ORAL | 1 refills | Status: DC

## 2022-03-28 NOTE — Progress Notes (Signed)
Established Patient Office Visit  Subjective   Patient ID: Darren Knapp, male    DOB: [birth year]  Age: 42 y.o. MRN: [id number]  Chief Complaint  Patient presents with   Hypertension    HPI   Darren Knapp is seen for recent multiple elevated blood pressure readings.  He recently to urgent care and had reading 168/115.  Denies any headaches or dizziness.  Does have history of alcohol abuse and occasional binging.  Has not had anything to drink for the past few days.  Has actually lost a bit of weight since last March.  Does occasionally smoke marijuana.  Strong family history of hypertension.  No recent peripheral edema.  No chest pains.  Past Medical History:  Diagnosis Date   Arthritis    Hypertension    Multiple sclerosis ([facility]) 2011   Neuromuscular disorder ([facility])    Obesity    History reviewed. No pertinent surgical history.  reports that he has quit smoking. He has never used smokeless tobacco. He reports current alcohol use of about 17.0 standard drinks of alcohol per week. He reports that he does not use drugs. family history includes Arthritis in his mother; Cancer (age of onset: 39) in his father; Diabetes in his father; Drug abuse in his brother; Heart disease in his maternal grandfather; Hypertension in his father and mother. Allergies  Allergen Reactions   Glatiramer Anxiety and Shortness Of Breath    Pressure behind his head   Iodine-131 Nausea Only   Ivp Dye [Iodinated Contrast Media] Nausea Only    Review of Systems  Constitutional:  Negative for malaise/fatigue.  Eyes:  Negative for blurred vision.  Respiratory:  Negative for shortness of breath.   Cardiovascular:  Negative for chest pain.  Neurological:  Negative for dizziness, weakness and headaches.      Objective:     BP (!) 156/100 (BP Location: Left Arm, Patient Position: Sitting, Cuff Size: Large)   Pulse 70   Temp 98.5 F (36.9 C) (Oral)   Ht 6' (1.829 m)   Wt 277 lb 6.4 oz (125.8 kg)   SpO2 99%    BMI 37.62 kg/m    Physical Exam Vitals reviewed.  Constitutional:      Appearance: He is well-developed.  HENT:     Right Ear: External ear normal.     Left Ear: External ear normal.  Eyes:     Pupils: Pupils are equal, round, and reactive to light.  Neck:     Thyroid: No thyromegaly.  Cardiovascular:     Rate and Rhythm: Normal rate and regular rhythm.  Pulmonary:     Effort: Pulmonary effort is normal. No respiratory distress.     Breath sounds: Normal breath sounds. No wheezing or rales.  Musculoskeletal:     Cervical back: Neck supple.     Right lower leg: No edema.     Left lower leg: No edema.  Neurological:     Mental Status: He is alert and oriented to person, place, and time.      No results found for any visits on 03/28/22.    The 10-year ASCVD risk score (Arnett DK, et al., 2019) is: 8.6%    Assessment & Plan:   Problem List Items Addressed This Visit       Unprioritized   Essential hypertension - Primary   Relevant Medications   amLODipine (NORVASC) 5 MG tablet  Patient presents with fairly severe elevated blood pressure with multiple recent elevated readings  -We discussed nonpharmacologic  management with weight loss, regular aerobic exercise, sodium reduction, alcohol reduction.  Handout on DASH diet given  -Start amlodipine 5 mg once daily.  Set up 1 month follow-up to reassess  Return in about 1 month (around 04/26/2022).    Carolann Littler, MD

## 2022-04-19 ENCOUNTER — Other Ambulatory Visit: Payer: Self-pay | Admitting: Family Medicine

## 2022-05-01 ENCOUNTER — Telehealth: Payer: Self-pay | Admitting: Family Medicine

## 2022-05-01 MED ORDER — AMLODIPINE BESYLATE 5 MG PO TABS: 5.0000 mg | ORAL_TABLET | Freq: Every day | ORAL | 1 refills | Status: DC

## 2022-05-01 NOTE — Telephone Encounter (Signed)
Prescription Request  05/01/2022  LOV: 03/28/2022  What is the name of the medication or equipment? amLODipine (NORVASC) 5 MG tablet   Have you contacted your pharmacy to request a refill? No   Which pharmacy would you like this sent to?  [id number] [address]: [phone]: [phone] Patient notified that their request is being sent to the clinical staff for review and that they should receive a response within 2 business days.   Please advise at Mobile [phone](mobile)

## 2022-05-01 NOTE — Telephone Encounter (Signed)
Rx sent 

## 2022-05-09 ENCOUNTER — Encounter: Payer: Self-pay | Admitting: Family Medicine

## 2022-05-09 ENCOUNTER — Ambulatory Visit (INDEPENDENT_AMBULATORY_CARE_PROVIDER_SITE_OTHER): Payer: BC Managed Care – PPO | Admitting: Family Medicine

## 2022-05-09 VITALS — BP 148/98 | HR 71 | Ht 72.0 in | Wt 281.1 lb

## 2022-05-09 DIAGNOSIS — I10 Essential (primary) hypertension: Secondary | ICD-10-CM

## 2022-05-09 MED ORDER — VALSARTAN-HYDROCHLOROTHIAZIDE 80-12.5 MG PO TABS: 1.0000 | ORAL_TABLET | Freq: Every day | ORAL | 3 refills | Status: DC

## 2022-05-09 NOTE — Progress Notes (Signed)
Established Patient Office Visit  Subjective   Patient ID: Darren Knapp, male    DOB: Jul 22, 1980  Age: 42 y.o. MRN: [id number]  Chief Complaint  Patient presents with   Medical Management of Chronic Issues    6 week follow up     HPI   Darren Knapp is here for follow-up hypertension.  Refer to last note for details.  He had significantly elevated blood pressure and we started amlodipine 5 mg daily.  Tolerating with no side effects.  He states he has been compliant taking this regularly.  Has not been exercising any regularly.  He has home blood pressure cuff but not monitoring regularly.  Has scaled back his alcohol but has not eliminated completely  Past Medical History:  Diagnosis Date   Arthritis    Hypertension    Multiple sclerosis ([facility]) 2011   Neuromuscular disorder ([facility])    Obesity    No past surgical history on file.  reports that he has quit smoking. He has never used smokeless tobacco. He reports current alcohol use of about 17.0 standard drinks of alcohol per week. He reports that he does not use drugs. family history includes Arthritis in his mother; Cancer (age of onset: 46) in his father; Diabetes in his father; Drug abuse in his brother; Heart disease in his maternal grandfather; Hypertension in his father and mother. Allergies  Allergen Reactions   Glatiramer Anxiety and Shortness Of Breath    Pressure behind his head   Iodine-131 Nausea Only   Ivp Dye [Iodinated Contrast Media] Nausea Only    Review of Systems  Constitutional:  Negative for malaise/fatigue.  Eyes:  Negative for blurred vision.  Respiratory:  Negative for shortness of breath.   Cardiovascular:  Negative for chest pain.  Neurological:  Negative for dizziness, weakness and headaches.      Objective:     BP (!) 148/98 (BP Location: Left Arm, Cuff Size: Large)   Pulse 71   Ht 6' (1.829 m)   Wt 281 lb 1.6 oz (127.5 kg)   SpO2 98%   BMI 38.12 kg/m  BP Readings from Last 3 Encounters:   05/09/22 (!) 148/98  03/28/22 (!) 156/100  03/25/22 (!) 160/113   Wt Readings from Last 3 Encounters:  05/09/22 281 lb 1.6 oz (127.5 kg)  03/28/22 277 lb 6.4 oz (125.8 kg)  10/18/21 271 lb 8 oz (123.2 kg)      Physical Exam Vitals reviewed.  Constitutional:      Appearance: Normal appearance.  Cardiovascular:     Rate and Rhythm: Normal rate and regular rhythm.  Pulmonary:     Effort: Pulmonary effort is normal.     Breath sounds: Normal breath sounds. No wheezing or rales.  Musculoskeletal:     Right lower leg: No edema.     Left lower leg: No edema.  Neurological:     Mental Status: He is alert.      No results found for any visits on 05/09/22.  Last CBC Lab Results  Component Value Date   WBC 4.2 03/16/2020   HGB 13.7 03/16/2020   HCT 40.7 03/16/2020   MCV 93.0 03/16/2020   RDW 13.0 03/16/2020   PLT 301.0 [id number]   Last metabolic panel Lab Results  Component Value Date   GLUCOSE 94 04/19/2021   NA 138 04/19/2021   K 4.3 04/19/2021   CL 100 04/19/2021   CO2 31 04/19/2021   BUN 16 04/19/2021   CREATININE 0.96 04/19/2021  CALCIUM 9.8 04/19/2021   PROT 7.1 03/16/2020   ALBUMIN 4.8 03/16/2020   BILITOT 1.1 03/16/2020   ALKPHOS 46 03/16/2020   AST 20 03/16/2020   ALT 34 03/16/2020   Last lipids Lab Results  Component Value Date   CHOL 249 (H) 04/19/2021   HDL 46.90 04/19/2021   LDLCALC 182 (H) 03/16/2020   LDLDIRECT 154.0 04/19/2021   TRIG 264.0 (H) 04/19/2021   CHOLHDL 5 04/19/2021      The 10-year ASCVD risk score (Arnett DK, et al., 2019) is: 13.1%    Assessment & Plan:   Hypertension suboptimally controlled.  -Discussed again nonpharmacologic measures with weight loss, regular exercise, sodium reduction, alcohol reduction  -Continue amlodipine 5 mg daily  -Add valsartan-HCTZ 80/12.5 mg 1 daily.  Set up 6-month follow-up.  Recheck basic metabolic panel at that time.  Carolann Littler, MD

## 2022-05-12 ENCOUNTER — Telehealth: Payer: Self-pay | Admitting: Family Medicine

## 2022-05-12 NOTE — Telephone Encounter (Signed)
This was on her AVS after his last visit: -Continue amlodipine 5 mg daily  -Add valsartan-HCTZ 80/12.5 mg 1 daily.  Set up 48-month follow-up.  Recheck basic metabolic panel at that time.  So he needs to continue Amlodipine 5 mg daily and valsartan-HCTZ 80-12.5 mg daily.  He can take the latter one in the morning and the Amlodipine at night. Thanks, BJ

## 2022-05-12 NOTE — Telephone Encounter (Signed)
Patient asking should he take amLODipine (NORVASC) 5 MG tablet and together with the valsartan-hydrochlorothiazide (DIOVAN-HCT) 80-12.5 MG tablet. Asking for a call to advise

## 2022-05-13 NOTE — Telephone Encounter (Signed)
Patient informed of the message below and verbalized understanding  

## 2022-05-13 NOTE — Telephone Encounter (Signed)
Left a message for the patient to return my call.  

## 2022-08-25 ENCOUNTER — Telehealth: Payer: Self-pay | Admitting: Family Medicine

## 2022-08-25 MED ORDER — VALSARTAN-HYDROCHLOROTHIAZIDE 80-12.5 MG PO TABS: 1.0000 | ORAL_TABLET | Freq: Every day | ORAL | 1 refills | Status: DC

## 2022-08-25 MED ORDER — AMLODIPINE BESYLATE 5 MG PO TABS: 5.0000 mg | ORAL_TABLET | Freq: Every day | ORAL | 1 refills | Status: DC

## 2022-08-25 NOTE — Telephone Encounter (Signed)
Prescription Request  08/25/2022  LOV: 05/09/2022  What is the name of the medication or equipment?   amLODipine (NORVASC) 5 MG tablet  valsartan-hydrochlorothiazide (DIOVAN-HCT) 80-12.5 MG tablet    Have you contacted your pharmacy to request a refill? No   Which pharmacy would you like this sent to?   [id number] [place], [place] - [address] [place] [address] Phone: [phone] Fax: [phone]  Patient notified that their request is being sent to the clinical staff for review and that they should receive a response within 2 business days.   Please advise at Mobile [phone] (mobile)

## 2022-08-25 NOTE — Telephone Encounter (Signed)
Rx sent 

## 2022-10-02 ENCOUNTER — Encounter (INDEPENDENT_AMBULATORY_CARE_PROVIDER_SITE_OTHER): Payer: Self-pay

## 2022-11-05 ENCOUNTER — Encounter: Payer: Self-pay | Admitting: Pharmacist

## 2022-11-14 ENCOUNTER — Ambulatory Visit: Payer: BC Managed Care – PPO | Admitting: Family Medicine

## 2023-01-09 ENCOUNTER — Encounter: Payer: Self-pay | Admitting: Family Medicine

## 2023-01-09 ENCOUNTER — Ambulatory Visit: Payer: BC Managed Care – PPO | Admitting: Family Medicine

## 2023-01-09 VITALS — BP 148/100 | HR 70 | Temp 98.2°F | Ht 73.0 in | Wt 266.4 lb

## 2023-01-09 DIAGNOSIS — I10 Essential (primary) hypertension: Secondary | ICD-10-CM | POA: Diagnosis not present

## 2023-01-09 DIAGNOSIS — E559 Vitamin D deficiency, unspecified: Secondary | ICD-10-CM

## 2023-01-09 DIAGNOSIS — E785 Hyperlipidemia, unspecified: Secondary | ICD-10-CM

## 2023-01-09 DIAGNOSIS — Z125 Encounter for screening for malignant neoplasm of prostate: Secondary | ICD-10-CM | POA: Diagnosis not present

## 2023-01-09 DIAGNOSIS — Z0000 Encounter for general adult medical examination without abnormal findings: Secondary | ICD-10-CM

## 2023-01-09 LAB — CBC WITH DIFFERENTIAL/PLATELET
Basophils Absolute: 0 10*3/uL (ref 0.0–0.1)
Basophils Relative: 0.7 % (ref 0.0–3.0)
Eosinophils Absolute: 0.2 10*3/uL (ref 0.0–0.7)
Eosinophils Relative: 2.9 % (ref 0.0–5.0)
HCT: 40.9 % (ref 39.0–52.0)
Hemoglobin: 13.6 g/dL (ref 13.0–17.0)
Lymphocytes Relative: 33.1 % (ref 12.0–46.0)
Lymphs Abs: 1.8 10*3/uL (ref 0.7–4.0)
MCHC: 33.3 g/dL (ref 30.0–36.0)
MCV: 95.6 fL (ref 78.0–100.0)
Monocytes Absolute: 0.4 10*3/uL (ref 0.1–1.0)
Monocytes Relative: 7.9 % (ref 3.0–12.0)
Neutro Abs: 3.1 10*3/uL (ref 1.4–7.7)
Neutrophils Relative %: 55.4 % (ref 43.0–77.0)
Platelets: 335 10*3/uL (ref 150.0–400.0)
RBC: 4.28 Mil/uL (ref 4.22–5.81)
RDW: 13.6 % (ref 11.5–15.5)
WBC: 5.5 10*3/uL (ref 4.0–10.5)

## 2023-01-09 LAB — PSA: PSA: 0.41 ng/mL (ref 0.10–4.00)

## 2023-01-09 LAB — LIPID PANEL
Cholesterol: 240 mg/dL — ABNORMAL HIGH (ref 0–200)
HDL: 50.5 mg/dL (ref >39.00)
LDL Cholesterol: 166 mg/dL — ABNORMAL HIGH (ref 0–99)
NonHDL: 189.73
Total CHOL/HDL Ratio: 5
Triglycerides: 121 mg/dL (ref 0.0–149.0)
VLDL: 24.2 mg/dL (ref 0.0–40.0)

## 2023-01-09 LAB — VITAMIN D 25 HYDROXY (VIT D DEFICIENCY, FRACTURES): VITD: 20.39 ng/mL — ABNORMAL LOW (ref 30.00–100.00)

## 2023-01-09 LAB — BASIC METABOLIC PANEL
BUN: 23 mg/dL (ref 6–23)
CO2: 32 meq/L (ref 19–32)
Calcium: 9.8 mg/dL (ref 8.4–10.5)
Chloride: 101 meq/L (ref 96–112)
Creatinine, Ser: 0.93 mg/dL (ref 0.40–1.50)
GFR: 101.48 mL/min (ref >60.00)
Glucose, Bld: 95 mg/dL (ref 70–99)
Potassium: 4.1 meq/L (ref 3.5–5.1)
Sodium: 139 meq/L (ref 135–145)

## 2023-01-09 LAB — HEPATIC FUNCTION PANEL
ALT: 24 U/L (ref 0–53)
AST: 19 U/L (ref 0–37)
Albumin: 4.9 g/dL (ref 3.5–5.2)
Alkaline Phosphatase: 48 U/L (ref 39–117)
Bilirubin, Direct: 0.2 mg/dL (ref 0.0–0.3)
Total Bilirubin: 1 mg/dL (ref 0.2–1.2)
Total Protein: 7 g/dL (ref 6.0–8.3)

## 2023-01-09 LAB — HEMOGLOBIN A1C: Hgb A1c MFr Bld: 5.4 % (ref 4.6–6.5)

## 2023-01-09 MED ORDER — VALSARTAN-HYDROCHLOROTHIAZIDE 80-12.5 MG PO TABS: 1.0000 | ORAL_TABLET | Freq: Every day | ORAL | 3 refills | Status: DC

## 2023-01-09 MED ORDER — AMLODIPINE BESYLATE 5 MG PO TABS: 5.0000 mg | ORAL_TABLET | Freq: Every day | ORAL | 3 refills | Status: DC

## 2023-01-09 NOTE — Progress Notes (Signed)
Established Patient Office Visit  Subjective   Patient ID: Darren Knapp, male    DOB: Dec 28, 1980  Age: 42 y.o. MRN: [id number]  Chief Complaint  Patient presents with   Annual Exam    HPI   Darren Knapp is seen for CPE.  Last March he was here and had elevated blood pressure and we added Diovan HCTZ but he never started.  He states he is still taking amlodipine 5 mg daily.  Not monitoring blood pressures regularly.  Does have some fatigue issues.  Poor dietary compliance recently though he has lost some weight.  Eating frequent fried foods and eats out frequently.  Not monitoring sodium intake.  Has scaled back alcohol overall.  No recent binge drinking.  Does drink some vodka.  He does have history of multiple sclerosis followed by neurology.  Symptomatically stable at this time.  Health maintenance reviewed:  Health Maintenance  Topic Date Due   COVID-19 Vaccine (1 - 2023-24 season) Never done   DTaP/Tdap/Td (3 - Td or Tdap) 03/25/2032   Hepatitis C Screening  Completed   HIV Screening  Completed   HPV VACCINES  Aged Out   INFLUENZA VACCINE  Discontinued   Family history-both parents with hypertension.  Father had type 2 diabetes and lung cancer.  2 brothers and 2 sisters.  1 brother with history of drug abuse  Social history-divorced.  Does have steady girlfriend.  No children.  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  No nicotine use.  Past history of alcohol abuse but currently infrequent alcohol use  Past Medical History:  Diagnosis Date   Arthritis    Hypertension    Multiple sclerosis ([facility]) 2011   Neuromuscular disorder ([facility])    Obesity    History reviewed. No pertinent surgical history.  reports that he has quit smoking. He has never used smokeless tobacco. He reports current alcohol use of about 17.0 standard drinks of alcohol per week. He reports that he does not use drugs. family history includes Arthritis in his mother;  Cancer (age of onset: 10) in his father; Diabetes in his father; Drug abuse in his brother; Heart disease in his maternal grandfather; Hypertension in his father and mother. Allergies  Allergen Reactions   Glatiramer Anxiety and Shortness Of Breath    Pressure behind his head   Iodine-131 Nausea Only   Ivp Dye [Iodinated Contrast Media] Nausea Only     Review of Systems  Constitutional:  Negative for chills, fever, malaise/fatigue and weight loss.  HENT:  Negative for hearing loss.   Eyes:  Negative for blurred vision and double vision.  Respiratory:  Negative for cough and shortness of breath.   Cardiovascular:  Negative for chest pain, palpitations and leg swelling.  Gastrointestinal:  Negative for abdominal pain, blood in stool, constipation and diarrhea.  Genitourinary:  Negative for dysuria.  Skin:  Negative for rash.  Neurological:  Negative for dizziness, speech change, seizures, loss of consciousness and headaches.  Psychiatric/Behavioral:  Negative for depression.       Objective:     BP (!) 148/100 (BP Location: Left Arm, Cuff Size: Large)   Pulse 70   Temp 98.2 F (36.8 C) (Oral)   Ht 6\' 1"  (1.854 m)   Wt 266 lb 6.4 oz (120.8 kg)   SpO2 98%   BMI 35.15 kg/m  BP Readings from Last 3 Encounters:  01/09/23 (!) 148/100  05/09/22 (!) 148/98  03/28/22 (!) 156/100  Wt Readings from Last 3 Encounters:  01/09/23 266 lb 6.4 oz (120.8 kg)  05/09/22 281 lb 1.6 oz (127.5 kg)  03/28/22 277 lb 6.4 oz (125.8 kg)      Physical Exam Vitals reviewed.  Constitutional:      General: He is not in acute distress.    Appearance: He is well-developed. He is not ill-appearing.  HENT:     Head: Normocephalic and atraumatic.     Right Ear: External ear normal.     Left Ear: External ear normal.  Eyes:     Conjunctiva/sclera: Conjunctivae normal.     Pupils: Pupils are equal, round, and reactive to light.  Neck:     Thyroid: No thyromegaly.  Cardiovascular:     Rate and  Rhythm: Normal rate and regular rhythm.     Heart sounds: Normal heart sounds. No murmur heard. Pulmonary:     Effort: No respiratory distress.     Breath sounds: No wheezing or rales.  Abdominal:     General: Bowel sounds are normal. There is no distension.     Palpations: Abdomen is soft. There is no mass.     Tenderness: There is no abdominal tenderness. There is no guarding or rebound.  Musculoskeletal:     Cervical back: Normal range of motion and neck supple.     Right lower leg: No edema.     Left lower leg: No edema.  Lymphadenopathy:     Cervical: No cervical adenopathy.  Skin:    Findings: No rash.  Neurological:     Mental Status: He is alert and oriented to person, place, and time.     Cranial Nerves: No cranial nerve deficit.      No results found for any visits on 01/09/23.  Last CBC Lab Results  Component Value Date   WBC 4.2 03/16/2020   HGB 13.7 03/16/2020   HCT 40.7 03/16/2020   MCV 93.0 03/16/2020   RDW 13.0 03/16/2020   PLT 301.0 03/16/2020   Last metabolic panel Lab Results  Component Value Date   GLUCOSE 94 04/19/2021   NA 138 04/19/2021   K 4.3 04/19/2021   CL 100 04/19/2021   CO2 31 04/19/2021   BUN 16 04/19/2021   CREATININE 0.96 04/19/2021   GFR 98.88 04/19/2021   CALCIUM 9.8 04/19/2021   PROT 7.1 03/16/2020   ALBUMIN 4.8 03/16/2020   BILITOT 1.1 03/16/2020   ALKPHOS 46 03/16/2020   AST 20 03/16/2020   ALT 34 03/16/2020   Last lipids Lab Results  Component Value Date   CHOL 249 (H) 04/19/2021   HDL 46.90 04/19/2021   LDLCALC 182 (H) 03/16/2020   LDLDIRECT 154.0 04/19/2021   TRIG 264.0 (H) 04/19/2021   CHOLHDL 5 04/19/2021   Last hemoglobin A1c Lab Results  Component Value Date   HGBA1C 5.6 04/19/2021   Last vitamin D No results found for: "25OHVITD2", "25OHVITD3", "VD25OH"    The 10-year ASCVD risk score (Arnett DK, et al., 2019) is: 8.2%    Assessment & Plan:   Problem List Items Addressed This Visit    None Visit Diagnoses     Physical exam    -  Primary   Relevant Orders   Basic metabolic panel   Lipid panel   CBC with Differential/Platelet   Hepatic function panel   PSA   Hemoglobin A1c   Vitamin D deficiency       Relevant Orders   Vitamin D, 25-hydroxy     42 year old  male here for physical.  He has hypertension poorly controlled.  Never went on Diovan HCTZ.  -Continue amlodipine 5 mg daily -Add Diovan HCTZ 80/12.5 mg 1 daily -Handout on DASH diet given.  Try to reduce sodium intake.  Also discussed importance of regular aerobic exercise and continue weight loss.  Set up follow-up in about 6 weeks to reassess -Declines flu vaccine -Consider screening colonoscopy by age 51 -He has history of low vitamin D.  Recheck 25-hydroxy vitamin D level.  Suggested over-the-counter vitamin D 2000 international units daily  Return in about 2 months (around 03/11/2023).    Evelena Peat, MD

## 2023-01-09 NOTE — Patient Instructions (Signed)
Start BOTH blood pressure medications  May take them at the same time.

## 2023-01-12 MED ORDER — ATORVASTATIN CALCIUM 10 MG PO TABS: 10.0000 mg | ORAL_TABLET | Freq: Every day | ORAL | 0 refills | Status: DC

## 2023-01-12 NOTE — Addendum Note (Signed)
Addended by: Christy Sartorius on: 01/12/2023 09:44 AM   Modules accepted: Orders

## 2023-01-12 NOTE — Addendum Note (Signed)
Addended by: Christy Sartorius on: 01/12/2023 09:45 AM   Modules accepted: Orders

## 2023-03-11 ENCOUNTER — Ambulatory Visit: Payer: BC Managed Care – PPO | Admitting: Family Medicine

## 2023-03-13 ENCOUNTER — Encounter: Payer: Self-pay | Admitting: Family Medicine

## 2023-03-13 ENCOUNTER — Ambulatory Visit (INDEPENDENT_AMBULATORY_CARE_PROVIDER_SITE_OTHER): Payer: BC Managed Care – PPO | Admitting: Family Medicine

## 2023-03-13 VITALS — BP 138/90 | HR 75 | Temp 98.3°F | Wt 269.8 lb

## 2023-03-13 DIAGNOSIS — R7989 Other specified abnormal findings of blood chemistry: Secondary | ICD-10-CM | POA: Diagnosis not present

## 2023-03-13 DIAGNOSIS — I10 Essential (primary) hypertension: Secondary | ICD-10-CM

## 2023-03-13 DIAGNOSIS — R41840 Attention and concentration deficit: Secondary | ICD-10-CM | POA: Diagnosis not present

## 2023-03-13 DIAGNOSIS — E785 Hyperlipidemia, unspecified: Secondary | ICD-10-CM | POA: Diagnosis not present

## 2023-03-13 NOTE — Progress Notes (Signed)
Established Patient Office Visit  Subjective   Patient ID: Darren Knapp, male    DOB: September 14, 1980  Age: 43 y.o. MRN: [id number]  No chief complaint on file.   HPI   Darren Knapp is seen for follow-up from recent physical.  He had elevated lipids and we started atorvastatin 10 mg daily.  Compliant with therapy.  Denies any side effects of medication.  He has hypertension treated with Diovan HCTZ and amlodipine.  He hopes to start working out more consistently soon.  He knows he needs to lose some weight.  His labs were also significant for low vitamin D of 20.  We suggest over-the-counter vitamin D 2000 IUs/day.  He has not started those yet.  He relates difficulties with job stress.  Works for [employer].  Has some increased irritability and difficulty focusing and completing tasks.  His mom shared that he was diagnosed at some point with ADD as a child.  He would like to consider further testing.  He does have some early morning awakening which she attributes to work anxiety.  Past Medical History:  Diagnosis Date   Arthritis    Hypertension    Multiple sclerosis ([facility]) 2011   Neuromuscular disorder ([facility])    Obesity    History reviewed. No pertinent surgical history.  reports that he has quit smoking. He has never used smokeless tobacco. He reports current alcohol use of about 17.0 standard drinks of alcohol per week. He reports that he does not use drugs. family history includes Arthritis in his mother; Cancer (age of onset: 32) in his father; Diabetes in his father; Drug abuse in his brother; Heart disease in his maternal grandfather; Hypertension in his father and mother. Allergies  Allergen Reactions   Glatiramer Anxiety and Shortness Of Breath    Pressure behind his head   Iodine-131 Nausea Only   Ivp Dye [Iodinated Contrast Media] Nausea Only    Review of Systems  Constitutional:  Negative for malaise/fatigue.  Eyes:  Negative for blurred vision.  Respiratory:  Negative for shortness  of breath.   Cardiovascular:  Negative for chest pain.  Neurological:  Negative for dizziness, weakness and headaches.  Psychiatric/Behavioral:  The patient has insomnia.       Objective:     BP (!) 138/90 (BP Location: Left Arm, Patient Position: Sitting, Cuff Size: Large)   Pulse 75   Temp 98.3 F (36.8 C) (Oral)   Wt 269 lb 12.8 oz (122.4 kg)   SpO2 98%   BMI 35.60 kg/m  BP Readings from Last 3 Encounters:  03/13/23 (!) 138/90  01/09/23 (!) 148/100  05/09/22 (!) 148/98   Wt Readings from Last 3 Encounters:  03/13/23 269 lb 12.8 oz (122.4 kg)  01/09/23 266 lb 6.4 oz (120.8 kg)  05/09/22 281 lb 1.6 oz (127.5 kg)      Physical Exam Constitutional:      Appearance: He is well-developed.  Eyes:     Pupils: Pupils are equal, round, and reactive to light.  Neck:     Thyroid: No thyromegaly.  Cardiovascular:     Rate and Rhythm: Normal rate and regular rhythm.  Pulmonary:     Effort: Pulmonary effort is normal. No respiratory distress.     Breath sounds: Normal breath sounds. No wheezing or rales.  Musculoskeletal:     Cervical back: Neck supple.  Neurological:     Mental Status: He is alert and oriented to person, place, and time.      No results  found for any visits on 03/13/23.  Last CBC Lab Results  Component Value Date   WBC 5.5 01/09/2023   HGB 13.6 01/09/2023   HCT 40.9 01/09/2023   MCV 95.6 01/09/2023   RDW 13.6 01/09/2023   PLT 335.0 01/09/2023   Last metabolic panel Lab Results  Component Value Date   GLUCOSE 95 01/09/2023   NA 139 01/09/2023   K 4.1 01/09/2023   CL 101 01/09/2023   CO2 32 01/09/2023   BUN 23 01/09/2023   CREATININE 0.93 01/09/2023   GFR 101.48 01/09/2023   CALCIUM 9.8 01/09/2023   PROT 7.0 01/09/2023   ALBUMIN 4.9 01/09/2023   BILITOT 1.0 01/09/2023   ALKPHOS 48 01/09/2023   AST 19 01/09/2023   ALT 24 01/09/2023   Last lipids Lab Results  Component Value Date   CHOL 240 (H) 01/09/2023   HDL 50.50 01/09/2023    LDLCALC 166 (H) 01/09/2023   LDLDIRECT 154.0 04/19/2021   TRIG 121.0 01/09/2023   CHOLHDL 5 01/09/2023   Last hemoglobin A1c Lab Results  Component Value Date   HGBA1C 5.4 01/09/2023   Last vitamin D Lab Results  Component Value Date   VD25OH 20.39 (L) 01/09/2023      The 10-year ASCVD risk score (Arnett DK, et al., 2019) is: 7%    Assessment & Plan:   #1 hyperlipidemia with recent LDL cholesterol 166.  Recent initiation of Lipitor 10 mg daily.  Check fasting lipid today with liver panel.  Handout on Mediterranean diet given.  Recommend more consistent exercise and low saturated fat diet.  #2 low vitamin D.  Recommend over-the-counter vitamin D 2000 international units once daily  #3 hypertension.  Marginal control today.  Work on weight loss.  Try to establish more consistent exercise.  Continue current medications.  #4 possible adult ADD.  Patient relates difficulty focusing most of his life.  He feels like this may be affecting his job currently.  He would like to get further testing.  We gave him information regarding our behavioral health division for consideration for adult ADD testing   No follow-ups on file.    Evelena Peat, MD

## 2023-03-20 ENCOUNTER — Other Ambulatory Visit: Payer: BC Managed Care – PPO

## 2023-03-30 ENCOUNTER — Other Ambulatory Visit (INDEPENDENT_AMBULATORY_CARE_PROVIDER_SITE_OTHER): Payer: BC Managed Care – PPO

## 2023-03-30 DIAGNOSIS — E785 Hyperlipidemia, unspecified: Secondary | ICD-10-CM

## 2023-03-30 LAB — HEPATIC FUNCTION PANEL
ALT: 27 U/L (ref 0–53)
AST: 20 U/L (ref 0–37)
Albumin: 4.4 g/dL (ref 3.5–5.2)
Alkaline Phosphatase: 44 U/L (ref 39–117)
Bilirubin, Direct: 0.1 mg/dL (ref 0.0–0.3)
Total Bilirubin: 0.7 mg/dL (ref 0.2–1.2)
Total Protein: 6.6 g/dL (ref 6.0–8.3)

## 2023-03-30 LAB — LIPID PANEL
Cholesterol: 163 mg/dL (ref 0–200)
HDL: 54.6 mg/dL
LDL Cholesterol: 71 mg/dL (ref 0–99)
NonHDL: 108.63
Total CHOL/HDL Ratio: 3
Triglycerides: 187 mg/dL — ABNORMAL HIGH (ref 0.0–149.0)
VLDL: 37.4 mg/dL (ref 0.0–40.0)

## 2023-04-17 ENCOUNTER — Other Ambulatory Visit: Payer: Self-pay | Admitting: Family Medicine

## 2023-05-11 ENCOUNTER — Other Ambulatory Visit: Payer: Self-pay | Admitting: Family Medicine

## 2023-05-11 NOTE — Telephone Encounter (Signed)
 Copied from CRM [id number]. Topic: Clinical - Medication Refill >> May 11, 2023  3:40 PM Deaijah H wrote: Most Recent Primary Care Visit:  Provider: [facility]  Department: [facility]  Visit Type: LAB VISIT  Date: 03/30/2023  Medication: atorvastatin (LIPITOR) 10 MG tablet  Has the patient contacted their pharmacy? No (Agent: If no, request that the patient contact the pharmacy for the refill. If patient does not wish to contact the pharmacy document the reason why and proceed with request.) (Agent: If yes, when and what did the pharmacy advise?)  Is this the correct pharmacy for this prescription? Yes If no, delete pharmacy and type the correct one.  This is the patient's preferred pharmacy:  [id number] [place], [place] - [address] AT [address] [place] [address] Phone: [phone] Fax: [phone]  Has the prescription been filled recently? Yes  Is the patient out of the medication? Yes  Has the patient been seen for an appointment in the last year OR does the patient have an upcoming appointment? Yes  Can we respond through MyChart? Yes  Agent: Please be advised that Rx refills may take up to 3 business days. We ask that you follow-up with your pharmacy.

## 2023-05-11 NOTE — Telephone Encounter (Signed)
 Copied from CRM [id number]. Topic: Clinical - Medication Refill >> May 11, 2023  3:40 PM Deaijah H wrote: Most Recent Primary Care Visit:  Provider: [facility]  Department: [facility]  Visit Type: LAB VISIT  Date: 03/30/2023  Medication: atorvastatin (LIPITOR) 10 MG tablet  Has the patient contacted their pharmacy? No (Agent: If no, request that the patient contact the pharmacy for the refill. If patient does not wish to contact the pharmacy document the reason why and proceed with request.) (Agent: If yes, when and what did the pharmacy advise?)  Is this the correct pharmacy for this prescription?  If no, delete pharmacy and type the correct one.  This is the patient's preferred pharmacy:  [id number] [place], [place] - [address] [place] [address] Phone: [phone] Fax: [phone]  Has the prescription been filled recently? Yes  Is the patient out of the medication? Yes  Has the patient been seen for an appointment in the last year OR does the patient have an upcoming appointment? Yes  Can we respond through MyChart? Yes  Agent: Please be advised that Rx refills may take up to 3 business days. We ask that you follow-up with your pharmacy.

## 2023-11-27 ENCOUNTER — Ambulatory Visit (INDEPENDENT_AMBULATORY_CARE_PROVIDER_SITE_OTHER): Admitting: Family Medicine

## 2023-11-27 ENCOUNTER — Encounter: Payer: Self-pay | Admitting: Family Medicine

## 2023-11-27 VITALS — BP 132/82 | HR 76 | Temp 98.3°F | Wt 240.2 lb

## 2023-11-27 DIAGNOSIS — R7989 Other specified abnormal findings of blood chemistry: Secondary | ICD-10-CM | POA: Diagnosis not present

## 2023-11-27 DIAGNOSIS — E785 Hyperlipidemia, unspecified: Secondary | ICD-10-CM

## 2023-11-27 DIAGNOSIS — R634 Abnormal weight loss: Secondary | ICD-10-CM | POA: Diagnosis not present

## 2023-11-27 DIAGNOSIS — I10 Essential (primary) hypertension: Secondary | ICD-10-CM

## 2023-11-27 LAB — CBC WITH DIFFERENTIAL/PLATELET
Basophils Absolute: 0 K/uL (ref 0.0–0.1)
Basophils Relative: 0.7 % (ref 0.0–3.0)
Eosinophils Absolute: 0.2 K/uL (ref 0.0–0.7)
Eosinophils Relative: 2.9 % (ref 0.0–5.0)
HCT: 40.4 % (ref 39.0–52.0)
Hemoglobin: 13.3 g/dL (ref 13.0–17.0)
Lymphocytes Relative: 35.6 % (ref 12.0–46.0)
Lymphs Abs: 1.9 K/uL (ref 0.7–4.0)
MCHC: 32.8 g/dL (ref 30.0–36.0)
MCV: 92.8 fl (ref 78.0–100.0)
Monocytes Absolute: 0.4 K/uL (ref 0.1–1.0)
Monocytes Relative: 7.9 % (ref 3.0–12.0)
Neutro Abs: 2.9 K/uL (ref 1.4–7.7)
Neutrophils Relative %: 52.9 % (ref 43.0–77.0)
Platelets: 282 K/uL (ref 150.0–400.0)
RBC: 4.35 Mil/uL (ref 4.22–5.81)
RDW: 13.5 % (ref 11.5–15.5)
WBC: 5.4 K/uL (ref 4.0–10.5)

## 2023-11-27 LAB — LIPID PANEL
Cholesterol: 165 mg/dL (ref 0–200)
HDL: 42.6 mg/dL (ref >39.00)
LDL Cholesterol: 72 mg/dL (ref 0–99)
NonHDL: 122.27
Total CHOL/HDL Ratio: 4
Triglycerides: 252 mg/dL — ABNORMAL HIGH (ref 0.0–149.0)
VLDL: 50.4 mg/dL — ABNORMAL HIGH (ref 0.0–40.0)

## 2023-11-27 LAB — COMPREHENSIVE METABOLIC PANEL WITH GFR
ALT: 27 U/L (ref 0–53)
AST: 21 U/L (ref 0–37)
Albumin: 4.8 g/dL (ref 3.5–5.2)
Alkaline Phosphatase: 47 U/L (ref 39–117)
BUN: 16 mg/dL (ref 6–23)
CO2: 28 meq/L (ref 19–32)
Calcium: 9.2 mg/dL (ref 8.4–10.5)
Chloride: 101 meq/L (ref 96–112)
Creatinine, Ser: 0.81 mg/dL (ref 0.40–1.50)
GFR: 108.3 mL/min (ref >60.00)
Glucose, Bld: 83 mg/dL (ref 70–99)
Potassium: 3.8 meq/L (ref 3.5–5.1)
Sodium: 138 meq/L (ref 135–145)
Total Bilirubin: 0.9 mg/dL (ref 0.2–1.2)
Total Protein: 6.7 g/dL (ref 6.0–8.3)

## 2023-11-27 LAB — HEPATIC FUNCTION PANEL
ALT: 27 U/L (ref 0–53)
AST: 21 U/L (ref 0–37)
Albumin: 4.7 g/dL (ref 3.5–5.2)
Alkaline Phosphatase: 47 U/L (ref 39–117)
Bilirubin, Direct: 0.1 mg/dL (ref 0.0–0.3)
Total Bilirubin: 0.9 mg/dL (ref 0.2–1.2)
Total Protein: 6.6 g/dL (ref 6.0–8.3)

## 2023-11-27 LAB — HEMOGLOBIN A1C: Hgb A1c MFr Bld: 5.5 % (ref 4.6–6.5)

## 2023-11-27 LAB — VITAMIN D 25 HYDROXY (VIT D DEFICIENCY, FRACTURES): VITD: 16.83 ng/mL — ABNORMAL LOW (ref 30.00–100.00)

## 2023-11-27 LAB — TSH: TSH: 1.02 u[IU]/mL (ref 0.35–5.50)

## 2023-11-27 LAB — PSA: PSA: 0.41 ng/mL (ref 0.10–4.00)

## 2023-11-27 LAB — SEDIMENTATION RATE: Sed Rate: 4 mm/h (ref 0–15)

## 2023-11-27 NOTE — Progress Notes (Signed)
 Established Patient Office Visit  Subjective   Patient ID: Darren Knapp, male    DOB: [birth year]  Age: 43 y.o. MRN: [id number]  Chief Complaint  Patient presents with   Weight Loss    HPI   Darren Knapp is seen today with reported unintentional weight loss.  He had documented 30 pound weight loss since he was here several months ago.  Generally feels well.  He states his appetite is relatively stable.  Frequently skips breakfast but this is not new.  Sometimes for lunch he will have a snack such as peanut butter crackers.  He stays very busy with work.  Usually has a decent dinner but frequently eats chicken without a lot of variety such as fruits and vegetables.  Denies any depression symptoms.  Denies any fever, chills, night sweats, abdominal pain, dysuria, gross hematuria, nausea, vomiting, hematochezia, chronic cough, dyspnea  He does have history of low vitamin D  and somewhat inconsistent with supplements.  Would like to get levels rechecked.  He has history of hypertension.  Treated with amlodipine  and valsartan  and HCTZ.  He did miss blood pressure medication dose yesterday which may explain blood pressure up today  Past Medical History:  Diagnosis Date   Arthritis    Hypertension    Multiple sclerosis 2011   Neuromuscular disorder (HCC)    Obesity    History reviewed. No pertinent surgical history.  reports that he has quit smoking. He has never used smokeless tobacco. He reports current alcohol use of about 17.0 standard drinks of alcohol per week. He reports that he does not use drugs. family history includes Arthritis in his mother; Cancer (age of onset: 80) in his father; Diabetes in his father; Drug abuse in his brother; Heart disease in his maternal grandfather; Hypertension in his father and mother. Allergies  Allergen Reactions   Glatiramer Anxiety and Shortness Of Breath    Pressure behind his head   Iodine-131 Nausea Only   Ivp Dye [Iodinated Contrast Media] Nausea  Only    Review of Systems  Constitutional:  Positive for weight loss. Negative for chills and fever.  Respiratory:  Negative for cough and shortness of breath.   Cardiovascular:  Negative for chest pain.  Gastrointestinal:  Negative for abdominal pain, blood in stool, diarrhea, melena, nausea and vomiting.  Genitourinary:  Negative for dysuria and hematuria.  Neurological:  Negative for headaches.  Psychiatric/Behavioral:  Negative for depression.       Objective:     BP 132/82 (BP Location: Right Arm, Cuff Size: Large)   Pulse 76   Temp 98.3 F (36.8 C) (Oral)   Wt 240 lb 3.2 oz (109 kg)   SpO2 99%   BMI 31.69 kg/m  BP Readings from Last 3 Encounters:  11/27/23 132/82  03/13/23 (!) 138/90  01/09/23 (!) 148/100   Wt Readings from Last 3 Encounters:  11/27/23 240 lb 3.2 oz (109 kg)  03/13/23 269 lb 12.8 oz (122.4 kg)  01/09/23 266 lb 6.4 oz (120.8 kg)      Physical Exam Vitals reviewed.  Constitutional:      General: He is not in acute distress.    Appearance: He is not ill-appearing.  HENT:     Head: Normocephalic and atraumatic.  Cardiovascular:     Rate and Rhythm: Normal rate and regular rhythm.  Pulmonary:     Effort: Pulmonary effort is normal.     Breath sounds: Normal breath sounds. No wheezing or rales.  Abdominal:  Palpations: Abdomen is soft. There is no mass.     Tenderness: There is no abdominal tenderness. There is no guarding or rebound.  Musculoskeletal:     Cervical back: Neck supple.  Lymphadenopathy:     Cervical: No cervical adenopathy.  Skin:    Findings: No rash.  Neurological:     General: No focal deficit present.     Mental Status: He is alert.  Psychiatric:        Mood and Affect: Mood normal.      No results found for any visits on 11/27/23.    The 10-year ASCVD risk score (Arnett DK, et al., 2019) is: 6%    Assessment & Plan:   Problem List Items Addressed This Visit   None Visit Diagnoses       Unintended  weight loss    -  Primary   Relevant Orders   CBC with Differential/Platelet   CMP   TSH   PSA   Hemoglobin A1c   VITAMIN D  25 Hydroxy (Vit-D Deficiency, Fractures)   Sedimentation rate     Low vitamin D  level       Relevant Orders   VITAMIN D  25 Hydroxy (Vit-D Deficiency, Fractures)     Elevated lipids         Patient presents with at least a few month history of unintentional weight loss.  He has had about 30 pounds weight loss documented.  Has not made any intentional dietary changes.  Does frequently skip meals.  He states he has tendency to get very busy with work and sometimes forgets to eat basically.  Does have prior history of low vitamin D  and requesting follow-up levels there.  Obtain multiple labs as above.  Discussed healthy eating habits.  Try to avoid skipping regular meals.  We also discussed importance of trying to get more variety especially with more fruits and vegetables.  Blood pressure up some initially but did come down some after rest the patient missed medication yesterday.  Get back on his usual blood pressure medications.  No follow-ups on file.    Darren Scarlet, MD

## 2023-11-28 ENCOUNTER — Ambulatory Visit: Payer: Self-pay | Admitting: Family Medicine

## 2023-11-30 ENCOUNTER — Telehealth: Payer: Self-pay

## 2023-11-30 MED ORDER — VITAMIN D (ERGOCALCIFEROL) 1.25 MG (50000 UNIT) PO CAPS: 50000.0000 [IU] | ORAL_CAPSULE | ORAL | 3 refills | Status: AC

## 2023-11-30 NOTE — Telephone Encounter (Signed)
 Please see result note

## 2023-11-30 NOTE — Addendum Note (Signed)
 Addended by: METTA MATTOCKS L on: 11/30/2023 10:30 AM   Modules accepted: Orders

## 2023-11-30 NOTE — Telephone Encounter (Signed)
 Copied from CRM [id number]. Topic: Clinical - Medication Question >> Nov 30, 2023  8:56 AM Burnard DEL wrote: Reason for CRM: Is provider sending in Vitamin D  pills for patient?   [id number] [place], [place] - [address]  Phone: [phone] Fax: [phone]

## 2024-01-17 ENCOUNTER — Other Ambulatory Visit: Payer: Self-pay | Admitting: Family Medicine
